# Patient Record
Sex: Female | Born: 1935 | Race: White | Hispanic: No | Smoking: Never smoker
Health system: Southern US, Community
[De-identification: ages and names within clinical notes are randomized; demographics above are authoritative.]

## PROBLEM LIST (undated history)

## (undated) DIAGNOSIS — K5790 Diverticulosis of intestine, part unspecified, without perforation or abscess without bleeding: Secondary | ICD-10-CM

## (undated) DIAGNOSIS — I10 Essential (primary) hypertension: Secondary | ICD-10-CM

## (undated) DIAGNOSIS — Z86010 Personal history of colonic polyps: Secondary | ICD-10-CM

## (undated) DIAGNOSIS — F0390 Unspecified dementia without behavioral disturbance: Secondary | ICD-10-CM

## (undated) DIAGNOSIS — M1990 Unspecified osteoarthritis, unspecified site: Secondary | ICD-10-CM

## (undated) DIAGNOSIS — I5189 Other ill-defined heart diseases: Secondary | ICD-10-CM

## (undated) DIAGNOSIS — E785 Hyperlipidemia, unspecified: Secondary | ICD-10-CM

## (undated) DIAGNOSIS — Z860101 Personal history of adenomatous and serrated colon polyps: Secondary | ICD-10-CM

## (undated) DIAGNOSIS — M810 Age-related osteoporosis without current pathological fracture: Secondary | ICD-10-CM

## (undated) HISTORY — PX: FRACTURE SURGERY: SHX138

## (undated) HISTORY — DX: Hyperlipidemia, unspecified: E78.5

## (undated) HISTORY — DX: Personal history of colonic polyps: Z86.010

## (undated) HISTORY — DX: Unspecified osteoarthritis, unspecified site: M19.90

## (undated) HISTORY — PX: EYE SURGERY: SHX253

## (undated) HISTORY — DX: Personal history of adenomatous and serrated colon polyps: Z86.0101

## (undated) HISTORY — DX: Other ill-defined heart diseases: I51.89

## (undated) HISTORY — PX: OTHER SURGICAL HISTORY: SHX169

## (undated) HISTORY — DX: Diverticulosis of intestine, part unspecified, without perforation or abscess without bleeding: K57.90

## (undated) HISTORY — DX: Essential (primary) hypertension: I10

---

## 1997-10-10 ENCOUNTER — Other Ambulatory Visit: Admission: RE | Admit: 1997-10-10 | Discharge: 1997-10-10 | Payer: Self-pay | Admitting: Cardiology

## 1999-06-26 ENCOUNTER — Encounter: Admission: RE | Admit: 1999-06-26 | Discharge: 1999-06-26 | Payer: Self-pay | Admitting: Cardiology

## 1999-06-26 ENCOUNTER — Encounter: Payer: Self-pay | Admitting: Cardiology

## 1999-07-01 ENCOUNTER — Encounter: Payer: Self-pay | Admitting: Cardiology

## 1999-07-01 ENCOUNTER — Encounter: Admission: RE | Admit: 1999-07-01 | Discharge: 1999-07-01 | Payer: Self-pay | Admitting: Cardiology

## 1999-11-20 ENCOUNTER — Other Ambulatory Visit: Admission: RE | Admit: 1999-11-20 | Discharge: 1999-11-20 | Payer: Self-pay | Admitting: Internal Medicine

## 2000-07-07 ENCOUNTER — Encounter: Admission: RE | Admit: 2000-07-07 | Discharge: 2000-07-07 | Payer: Self-pay | Admitting: Internal Medicine

## 2000-07-07 ENCOUNTER — Encounter: Payer: Self-pay | Admitting: Internal Medicine

## 2001-01-13 ENCOUNTER — Encounter: Payer: Self-pay | Admitting: Internal Medicine

## 2001-07-13 ENCOUNTER — Encounter: Payer: Self-pay | Admitting: Internal Medicine

## 2001-07-13 ENCOUNTER — Encounter: Admission: RE | Admit: 2001-07-13 | Discharge: 2001-07-13 | Payer: Self-pay | Admitting: Internal Medicine

## 2002-07-18 ENCOUNTER — Encounter: Admission: RE | Admit: 2002-07-18 | Discharge: 2002-07-18 | Payer: Self-pay | Admitting: Internal Medicine

## 2002-07-18 ENCOUNTER — Encounter: Payer: Self-pay | Admitting: Internal Medicine

## 2004-02-05 ENCOUNTER — Ambulatory Visit: Payer: Self-pay | Admitting: Internal Medicine

## 2004-02-13 ENCOUNTER — Ambulatory Visit: Payer: Self-pay | Admitting: Internal Medicine

## 2004-02-27 ENCOUNTER — Encounter: Admission: RE | Admit: 2004-02-27 | Discharge: 2004-02-27 | Payer: Self-pay | Admitting: Internal Medicine

## 2005-03-11 ENCOUNTER — Encounter: Admission: RE | Admit: 2005-03-11 | Discharge: 2005-03-11 | Payer: Self-pay | Admitting: Internal Medicine

## 2006-03-23 ENCOUNTER — Encounter: Admission: RE | Admit: 2006-03-23 | Discharge: 2006-03-23 | Payer: Self-pay | Admitting: Internal Medicine

## 2007-04-02 ENCOUNTER — Encounter: Admission: RE | Admit: 2007-04-02 | Discharge: 2007-04-02 | Payer: Self-pay | Admitting: Internal Medicine

## 2008-04-06 ENCOUNTER — Encounter: Admission: RE | Admit: 2008-04-06 | Discharge: 2008-04-06 | Payer: Self-pay | Admitting: Internal Medicine

## 2009-02-22 ENCOUNTER — Encounter (INDEPENDENT_AMBULATORY_CARE_PROVIDER_SITE_OTHER): Payer: Self-pay | Admitting: *Deleted

## 2009-03-23 ENCOUNTER — Ambulatory Visit (HOSPITAL_BASED_OUTPATIENT_CLINIC_OR_DEPARTMENT_OTHER): Admission: RE | Admit: 2009-03-23 | Discharge: 2009-03-23 | Payer: Self-pay | Admitting: Orthopedic Surgery

## 2009-04-17 ENCOUNTER — Encounter (INDEPENDENT_AMBULATORY_CARE_PROVIDER_SITE_OTHER): Payer: Self-pay | Admitting: *Deleted

## 2009-04-26 ENCOUNTER — Encounter: Admission: RE | Admit: 2009-04-26 | Discharge: 2009-04-26 | Payer: Self-pay | Admitting: Internal Medicine

## 2009-08-27 ENCOUNTER — Ambulatory Visit: Payer: Self-pay | Admitting: Internal Medicine

## 2009-08-27 DIAGNOSIS — Z860100 Personal history of colon polyps, unspecified: Secondary | ICD-10-CM | POA: Insufficient documentation

## 2009-08-27 DIAGNOSIS — K5730 Diverticulosis of large intestine without perforation or abscess without bleeding: Secondary | ICD-10-CM | POA: Insufficient documentation

## 2009-09-11 ENCOUNTER — Ambulatory Visit: Payer: Self-pay | Admitting: Internal Medicine

## 2009-09-12 ENCOUNTER — Encounter: Payer: Self-pay | Admitting: Internal Medicine

## 2010-03-27 ENCOUNTER — Other Ambulatory Visit: Payer: Self-pay | Admitting: Internal Medicine

## 2010-03-27 DIAGNOSIS — Z1239 Encounter for other screening for malignant neoplasm of breast: Secondary | ICD-10-CM

## 2010-03-29 ENCOUNTER — Encounter
Admission: RE | Admit: 2010-03-29 | Discharge: 2010-03-29 | Payer: Self-pay | Source: Home / Self Care | Attending: Internal Medicine | Admitting: Internal Medicine

## 2010-04-02 NOTE — Assessment & Plan Note (Signed)
Summary: SCREEN FOR COLON/PT REQ CONSULT FIRST   History of Present Illness Visit Type: Initial Consult Primary GI MD: Yancey Flemings MD Primary Provider: Rodrigo Ran, MD Requesting Provider: Rodrigo Ran, MD Chief Complaint: Colon screening, patient not having any problems History of Present Illness:   77 year old female with hypertension, hyperlipidemia, adenomatous colon polyps, and diverticulosis. She presents today regarding surveillance colonoscopy. Her index colonoscopy was performed in 2002. Small adenoma removed. Followup in 2005 revealed no adenomatous polyps. She is now due for followup surveillance. Her health has been good with no significant interval hospitalizations or surgeries aside from wrist fracture. She's had no problems with her diverticulosis. Hemoccult testing in January was negative. Currently, her GI review of systems is unremarkable. I was sorry to hear that her husband Verdon Cummins pasted.   GI Review of Systems      Denies abdominal pain, acid reflux, belching, bloating, chest pain, dysphagia with liquids, dysphagia with solids, heartburn, loss of appetite, nausea, vomiting, vomiting blood, weight loss, and  weight gain.      Reports diverticulosis.     Denies anal fissure, black tarry stools, change in bowel habit, constipation, diarrhea, fecal incontinence, heme positive stool, hemorrhoids, irritable bowel syndrome, jaundice, light color stool, liver problems, rectal bleeding, and  rectal pain. Preventive Screening-Counseling & Management  Alcohol-Tobacco     Smoking Status: never      Drug Use:  no.      Current Medications (verified): 1)  Losartan Potassium 100 Mg Tabs (Losartan Potassium) .Marland Kitchen.. 1 Tablet By Mouth Once Daily 2)  Simvastatin 10 Mg Tabs (Simvastatin) .Marland Kitchen.. 1 By Mouth Once Daily 3)  Fosamax 70 Mg Tabs (Alendronate Sodium) .Marland Kitchen.. 1 By Mouth Every Week 4)  Calcium 600+d Plus Minerals 600-400 Mg-Unit Tabs (Calcium Carbonate-Vit D-Min) .Marland Kitchen.. 1 By Mouth Two  Times A Day 5)  Vitamin D3 1000 Unit Tabs (Cholecalciferol) .... Once Daily 6)  Aspirin 81 Mg Tbec (Aspirin) .Marland Kitchen.. 1 Tablet By Mouth Once Daily 7)  Vitamin B-12 500 Mcg Tabs (Cyanocobalamin) .... Once Daily 8)  Fish Oil Maximum Strength 1200 Mg Caps (Omega-3 Fatty Acids) .... Take 2 Capsule By Mouth Two Times A Day 9)  Cvs Vision Formula  Tabs (Multiple Vitamins-Minerals) .... Once Daily  Allergies (verified): No Known Drug Allergies  Past History:  Past Medical History: Reviewed history from 07/10/2009 and no changes required. Hypertension Diverticulosis OA Hyperlipidemia Diastolic Dysfunction  Past Surgical History: Unremarkable  Family History: No FH of Colon Cancer:  Social History: Married 2 children Occupation: Retired Patient has never smoked.  Alcohol Use - no Daily Caffeine Use Illicit Drug Use - no Smoking Status:  never Drug Use:  no  Review of Systems       The patient complains of sleeping problems and voice change.  The patient denies allergy/sinus, anemia, anxiety-new, arthritis/joint pain, back pain, blood in urine, breast changes/lumps, change in vision, confusion, cough, coughing up blood, depression-new, fainting, fatigue, fever, headaches-new, hearing problems, heart murmur, heart rhythm changes, itching, menstrual pain, muscle pains/cramps, night sweats, nosebleeds, pregnancy symptoms, shortness of breath, skin rash, sore throat, swelling of feet/legs, swollen lymph glands, thirst - excessive , urination - excessive , urination changes/pain, urine leakage, and vision changes.    Vital Signs:  Patient profile:   77 year old female Height:      62.5 inches Weight:      129.50 pounds BMI:     23.39 Pulse rate:   68 / minute Pulse rhythm:   regular BP sitting:  124 / 66  (left arm) Cuff size:   regular  Vitals Entered By: June McMurray CMA Duncan Dull) (August 27, 2009 2:36 PM)  Physical Exam  General:  Well developed, thin,well nourished, no acute  distress. Head:  Normocephalic and atraumatic. Eyes:  anicteric conjunctiva pink Mouth:  No deformity or lesions Lungs:  Clear throughout to auscultation. Heart:  Regular rate and rhythm; no murmurs, rubs,  or bruits. Abdomen:  Soft, nontender and nondistended. No masses, hepatosplenomegaly or hernias noted. Normal bowel sounds. Rectal:  deferred until colonoscopy Msk:  Symmetrical with no gross deformities. Normal posture. Pulses:  Normal pulses noted. Extremities:  No clubbing, cyanosis, edema or deformities noted. Neurologic:  Alert and  oriented x4;  grossly normal neurologically. Skin:  Intact without significant lesions or rashes. Psych:  Alert and cooperative. Normal mood and affect.   Impression & Recommendations:  Problem # 1:  PERSONAL HISTORY OF COLONIC POLYPS (ICD-V28.52) 77 year old female with a history of adenomatous colon polyps. Medically fit and interested in surveillance colonoscopy. The nature of the procedure as well as the risks, benefits, and alternatives were reviewed in detail. She understood and agreed to proceed. Movi prep prescribed. The patient instructed on its use..  Problem # 2:  DIVERTICULOSIS OF COLON (ICD-562.10) asymptomatic. Continue high-fiber diet.  Other Orders: Colonoscopy (Colon)  Patient Instructions: 1)  Colonoscopy [facility] 09/11/09 9:30 am arrive at 8:30 am 2)  Movi prep instructions given to patient. 3)  Movi prep Rx. sent to pharmacy 4)  Colonoscopy and Flexible Sigmoidoscopy brochure given.  5)  Copy sent to : Rodrigo Ran, MD 6)  The medication list was reviewed and reconciled.  All changed / newly prescribed medications were explained.  A complete medication list was provided to the patient / caregiver. Prescriptions: MOVIPREP 100 GM  SOLR (PEG-KCL-NACL-NASULF-NA ASC-C) As per prep instructions.  #1 x 0   Entered by:   Milford Cage NCMA   Authorized by:   Hilarie Fredrickson MD   Signed by:   Milford Cage NCMA on 08/27/2009   Method used:    Electronically to        [facility]  [place] [address] (retail)       [address]       [place], [place]  [address]       Ph: [phone] or [phone]       Fax: [phone]   RxID:   [id number]

## 2010-04-02 NOTE — Letter (Signed)
Summary: New Patient letter  [facility]  [address] [place], [place] [address]   Phone: [phone]  Fax: [phone]       04/17/2009 MRN: [id number]  Connie Trujillo [address] [place], [place]  [address]  Dear Connie Trujillo,  Welcome to the Gastroenterology Division at [facility].    You are scheduled to see Dr.   Marina Goodell on 06-12-09 at 11am on the 3rd floor at [facility], [address]  We ask that you try to arrive at our  office 15 minutes prior to your appointment time to allow for check-in.  We would like you to complete the enclosed self-administered evaluation form prior to your visit and bring it with you on the day of your appointment.  We will review it with you.  Also, please bring a complete list of all your medications or, if you prefer, bring the medication bottles and we will list them.  Please bring your insurance card so that we may make a copy of it.  If your insurance requires a referral to see a specialist, please bring your referral form from your primary care physician.  Co-payments are due at the time of your visit and may be paid by cash, check or credit card.     Your office visit will consist of a consult with your physician (includes a physical exam), any laboratory testing he/she may order, scheduling of any necessary diagnostic testing (e.g. x-ray, ultrasound, CT-scan), and scheduling of a procedure (e.g. Endoscopy, Colonoscopy) if required.  Please allow enough time on your schedule to allow for any/all of these possibilities.    If you cannot keep your appointment, please call [phone] to cancel or reschedule prior to your appointment date.  This allows [place] the opportunity to schedule an appointment for another patient in need of care.  If you do not cancel or reschedule by 5 p.m. the business day prior to your appointment date, you will be charged a $50.00 late cancellation/no-show fee.    Thank you for choosing [facility] for your medical needs.  We appreciate the opportunity to care for you.  Please visit [place] at our website  to learn more about our practice.                     Sincerely,                                                             The Gastroenterology Division

## 2010-04-02 NOTE — Letter (Signed)
Summary: [facility] Instructions  [facility]  [address] [place], [place] [address]   Phone: [phone]  Fax: [phone]       Connie Trujillo    January 23, 1929    MRN: [id number]        Procedure Day /Date:TUESDAY, 09/11/09     Arrival Time:8:30 AM     Procedure Time:9:30 AM     Location of Procedure:                    X  [facility] (4th Floor)                         PREPARATION FOR COLONOSCOPY WITH MOVIPREP   Starting 5 days prior to your procedure 09/07/09 do not eat nuts, seeds, popcorn, corn, beans, peas,  salads, or any raw vegetables.  Do not take any fiber supplements (e.g. Metamucil, Citrucel, and Benefiber).  THE DAY BEFORE YOUR PROCEDURE         DATE: 09/10/09 DAY: MONDAY  1.  Drink clear liquids the entire day-NO SOLID FOOD  2.  Do not drink anything colored red or purple.  Avoid juices with pulp.  No orange juice.  3.  Drink at least 64 oz. (8 glasses) of fluid/clear liquids during the day to prevent dehydration and help the prep work efficiently.  CLEAR LIQUIDS INCLUDE: Water Jello Ice Popsicles Tea (sugar ok, no milk/cream) Powdered fruit flavored drinks Coffee (sugar ok, no milk/cream) Gatorade Juice: apple, white grape, white cranberry  Lemonade Clear bullion, consomm, broth Carbonated beverages (any kind) Strained chicken noodle soup Hard Candy                             4.  In the morning, mix first dose of MoviPrep solution:    Empty 1 Pouch A and 1 Pouch B into the disposable container    Add lukewarm drinking water to the top line of the container. Mix to dissolve    Refrigerate (mixed solution should be used within 24 hrs)  5.  Begin drinking the prep at 5:00 p.m. The MoviPrep container is divided by 4 marks.   Every 15 minutes drink the solution down to the next mark (approximately 8 oz) until the full liter is complete.   6.  Follow completed prep with 16 oz of clear liquid of your choice (Nothing red or  purple).  Continue to drink clear liquids until bedtime.  7.  Before going to bed, mix second dose of MoviPrep solution:    Empty 1 Pouch A and 1 Pouch B into the disposable container    Add lukewarm drinking water to the top line of the container. Mix to dissolve    Refrigerate  THE DAY OF YOUR PROCEDURE      DATE: 09/11/09 DAY: TUESDAY  Beginning at 4:30 a.m. (5 hours before procedure):         1. Every 15 minutes, drink the solution down to the next mark (approx 8 oz) until the full liter is complete.  2. Follow completed prep with 16 oz. of clear liquid of your choice.    3. You may drink clear liquids until 7:30 AM (2 HOURS BEFORE PROCEDURE).   MEDICATION INSTRUCTIONS  Unless otherwise instructed, you should take regular prescription medications with a small sip of water   as early as possible the morning of your procedure.  OTHER INSTRUCTIONS  You will need a responsible adult at least 75 years of age to accompany you and drive you home.   This person must remain in the waiting room during your procedure.  Wear loose fitting clothing that is easily removed.  Leave jewelry and other valuables at home.  However, you may wish to bring a book to read or  an iPod/MP3 player to listen to music as you wait for your procedure to start.  Remove all body piercing jewelry and leave at home.  Total time from sign-in until discharge is approximately 2-3 hours.  You should go home directly after your procedure and rest.  You can resume normal activities the  day after your procedure.  The day of your procedure you should not:   Drive   Make legal decisions   Operate machinery   Drink alcohol   Return to work  You will receive specific instructions about eating, activities and medications before you leave.    The above instructions have been reviewed and explained to me by   _______________________    I fully understand and can verbalize these  instructions _____________________________ Date _________

## 2010-04-02 NOTE — Procedures (Signed)
Summary: Colonoscopy   Colonoscopy  Procedure date:  02/13/2004  Findings:      Location:  [facility].  Results: Diverticulosis.         Comments:      Repeat colonoscopy in 5 years.   Procedures Next Due Date:    Colonoscopy: 01/2009 Patient Name: Connie Trujillo, Connie Trujillo MRN:  Procedure Procedures: Colonoscopy CPT: [address].  Personnel: Endoscopist: Wilhemina Bonito. Marina Goodell, MD.  Exam Location: Exam performed in Outpatient Clinic. Outpatient  Patient Consent: Procedure, Alternatives, Risks and Benefits discussed, consent obtained, from patient. Consent was obtained by the RN.  Indications  Surveillance of: Adenomatous Polyp(s). This is an initial surveillance exam. Initial polypectomy was performed in 2002. in Nov. 1-2 Polyps were found at Index Exam. Largest polyp removed was 1 to 5 mm. Prior polyp located in distal colon. Pathology of worst  polyp: tubular adenoma.  History  Current Medications: Patient is not currently taking Coumadin.  Pre-Exam Physical: Performed Feb 13, 2004. Entire physical exam was normal.  Exam Exam: Extent of exam reached: Cecum, extent intended: Cecum.  The cecum was identified by appendiceal orifice and IC valve. Patient position: on left side. Colon retroflexion performed. Images taken. ASA Classification: I. Tolerance: excellent.  Monitoring: Pulse and BP monitoring, Oximetry used. Supplemental O2 given.  Colon Prep Used Miralax for colon prep. Prep results: excellent.  Sedation Meds: Patient assessed and found to be appropriate for moderate (conscious) sedation. Fentanyl 50 mcg. given IV. Versed 7 mg. given IV.  Findings NORMAL EXAM: Cecum to Rectum.  - DIVERTICULOSIS: Ascending Colon to Sigmoid Colon. ICD9: Diverticulosis, Colon: 562.10.    Comments: NO POLYPS SEEN Assessment Abnormal examination, see findings above.  Diagnoses: 562.10: Diverticulosis, Colon.   Events  Unplanned Interventions: No intervention was  required.  Unplanned Events: There were no complications. Plans Disposition: After procedure patient sent to recovery. After recovery patient sent home.  Scheduling/Referral: Colonoscopy, to Wilhemina Bonito. Marina Goodell, MD, IN 5 YEARS,    This report was created from the original endoscopy report, which was reviewed and signed by the above listed endoscopist.   cc:  Rodrigo Ran, MD      The Patient

## 2010-04-02 NOTE — Letter (Signed)
Summary: Patient Notice- Polyp Results  [facility]  [address] [place], [place] [address]   Phone: [phone]  Fax: [phone]        September 12, 2009 MRN: [id number]    GARNELL BEGEMAN [address] [place], [place]  [address]    Dear Ms. [name],  I am pleased to inform you that the colon polyp(s) removed during your recent colonoscopy was (were) found to be benign (no cancer detected) upon pathologic examination.   Should you develop new or worsening symptoms of abdominal pain, bowel habit changes or bleeding from the rectum or bowels, please schedule an evaluation with either your primary care physician or with me.  Additional information/recommendations:  __ No further action with gastroenterology is needed at this time. Please      follow-up with your primary care physician for your other healthcare      needs.   Please call us if you are having persistent problems or have questions about your condition that have not been fully answered at this time.  Sincerely,  Hilarie Fredrickson MD  This letter has been electronically signed by your physician.  Appended Document: Patient Notice- Polyp Results letter mailed.

## 2010-04-02 NOTE — Procedures (Signed)
Summary: Colonoscopy  Patient: Sagrario Lineberry Note: All result statuses are Final unless otherwise noted.  Tests: (1) Colonoscopy (COL)   COL Colonoscopy           DONE     [facility]     [address]     [place], [place]  [address]           COLONOSCOPY PROCEDURE REPORT           PATIENT:  Cailah, Reach  MR#:  [id number]     BIRTHDATE:  08/10/1928, 80 yrs. old  GENDER:  female     ENDOSCOPIST:  Wilhemina Bonito. Eda Keys, MD     REF. BY:  Surveillance Program Recall,     PROCEDURE DATE:  09/11/2009     PROCEDURE:  Colonoscopy with snare polypectomy x 1     ASA CLASS:  Class II     INDICATIONS:  history of pre-cancerous (adenomatous) colon polyps,     surveillance and high-risk screening ; index exam 2002 w/ small     adenoma; f/u 2005 negative     MEDICATIONS:   Fentanyl 50 mcg IV, Versed 5 mg IV           DESCRIPTION OF PROCEDURE:   After the risks benefits and     alternatives of the procedure were thoroughly explained, informed     consent was obtained.  Digital rectal exam was performed and     revealed no abnormalities.   The LB [id number] endoscope     was introduced through the anus and advanced to the cecum, which     was identified by both the appendix and ileocecal valve, without     limitations.Time to cecum = 4:17 min.  The quality of the prep was     excellent, using MoviPrep.  The instrument was then slowly     withdrawn (time = 11:22 min) as the colon was fully examined.     <<PROCEDUREIMAGES>>           FINDINGS:  A diminutive polyp was found in the transverse colon.     Polyp was snared without cautery. Retrieval was successful. snare     polyp  Moderate diverticulosis was found in the left colon.     Retroflexed views in the rectum revealed no abnormalities.    The     scope was then withdrawn from the patient and the procedure     completed.           COMPLICATIONS:  None     ENDOSCOPIC IMPRESSION:     1) Diminutive polyp in the transverse colon -  removed     2) Moderate diverticulosis in the left colon           RECOMMENDATIONS:     1) Return to the care of your primary provider. GI follow up as     needed           ______________________________     Wilhemina Bonito. Eda Keys, MD           CC:  Rodrigo Ran, MD;The Patient           n.     Rosalie DoctorWilhemina Bonito. Eda Keys at 09/11/2009 10:13 AM           [employer], [phone]  Note: An exclamation mark (!) indicates a result that was not dispersed into the flowsheet. Document Creation Date: 09/11/2009 10:14 AM _______________________________________________________________________  Marland Kitchen  1) Order result status: Final Collection or observation date-time: 09/11/2009 10:09 Requested date-time:  Receipt date-time:  Reported date-time:  Referring Physician:   Ordering Physician: Fransico Setters [id number]) Specimen Source:  Source: Launa Grill Order Number: [id number] Lab site:   Appended Document: Colonoscopy     Colonoscopy  Procedure date:  09/11/2009  Findings:      Location:  [facility].

## 2010-04-02 NOTE — Procedures (Signed)
Summary: Colonoscopy   Colonoscopy  Procedure date:  01/13/2001  Findings:      Location:  [facility].  Results: Diverticulosis.       Pathology:  Adenomatous polyp.          Procedures Next Due Date:    Colonoscopy: 01/2004 Patient Name: Connie Trujillo, Connie Trujillo MRN:  Procedure Procedures: Colonoscopy CPT: 29528.    with polypectomy. CPT: A3573898.  Personnel: Endoscopist: Wilhemina Bonito. Marina Goodell, MD.  Referred By: Rodrigo Ran, MD.  Exam Location: Exam performed in Outpatient Clinic. Outpatient  Patient Consent: Procedure, Alternatives, Risks and Benefits discussed, consent obtained, from patient.  Indications Symptoms: Change in bowel habits.  Average Risk Screening Routine.  History  Pre-Exam Physical: Performed Jan 13, 2001. Cardio-pulmonary exam, Rectal exam, HEENT exam , Abdominal exam, Extremity exam, Neurological exam, Mental status exam WNL.  Exam Exam: Extent of exam reached: Cecum, extent intended: Cecum.  The cecum was identified by appendiceal orifice and IC valve. Patient position: on left side. Colon retroflexion performed. Images taken. ASA Classification: I. Tolerance: excellent.  Monitoring: Pulse and BP monitoring, Oximetry used. Supplemental O2 given.  Colon Prep Used Golytely for colon prep. Prep results: excellent.  Sedation Meds: Patient assessed and found to be appropriate for moderate (conscious) sedation. Fentanyl 50 mcg. Versed 4 mg.  Findings NORMAL EXAM: Cecum.  - DIVERTICULOSIS: Descending Colon to Sigmoid Colon. ICD9: Diverticulosis, Colon: 562.10.  POLYP: Sigmoid Colon, Maximum size: 5 mm. sessile polyp. Distance from Anus 33 cm. Procedure:  snare with cautery, removed, retrieved, Polyp sent to pathology. ICD9: Colon Polyps: 211.3.   Assessment Abnormal examination, see findings above.  Diagnoses: 562.10: Diverticulosis, Colon.  211.3: Colon Polyps.   Events  Unplanned Interventions: No intervention was required.    Unplanned Events: There were no complications. Plans Medication Plan: Fiber supplements:   Disposition: After procedure patient sent to recovery. After recovery patient sent home.  Scheduling/Referral: Colonoscopy, to Wilhemina Bonito. Marina Goodell, MD, in 3 years if polyp adenomatous,    This report was created from the original endoscopy report, which was reviewed and signed by the above listed endoscopist.   cc:  Rodrigo Ran, MD

## 2010-05-01 ENCOUNTER — Ambulatory Visit
Admission: RE | Admit: 2010-05-01 | Discharge: 2010-05-01 | Disposition: A | Discharge status: Home / Self Care | Payer: BC Managed Care – PPO | Source: Ambulatory Visit | Attending: Internal Medicine | Admitting: Internal Medicine

## 2010-05-01 DIAGNOSIS — Z1239 Encounter for other screening for malignant neoplasm of breast: Secondary | ICD-10-CM

## 2010-05-20 LAB — POCT I-STAT, CHEM 8
Calcium, Ion: 1.03 mmol/L — ABNORMAL LOW (ref 1.12–1.32)
Chloride: 106 mEq/L (ref 96–112)
Creatinine, Ser: 1 mg/dL (ref 0.4–1.2)
Glucose, Bld: 89 mg/dL (ref 70–99)
Potassium: 4.3 mEq/L (ref 3.5–5.1)
Sodium: 140 mEq/L (ref 135–145)
TCO2: 30 mmol/L (ref 0–100)

## 2011-03-25 ENCOUNTER — Other Ambulatory Visit: Payer: Self-pay | Admitting: Internal Medicine

## 2011-03-25 DIAGNOSIS — Z1231 Encounter for screening mammogram for malignant neoplasm of breast: Secondary | ICD-10-CM

## 2011-05-06 ENCOUNTER — Ambulatory Visit: Payer: BC Managed Care – PPO

## 2011-05-07 ENCOUNTER — Ambulatory Visit
Admission: RE | Admit: 2011-05-07 | Discharge: 2011-05-07 | Disposition: A | Discharge status: Home / Self Care | Payer: Medicare Other | Source: Ambulatory Visit | Attending: Internal Medicine | Admitting: Internal Medicine

## 2011-05-07 DIAGNOSIS — Z1231 Encounter for screening mammogram for malignant neoplasm of breast: Secondary | ICD-10-CM

## 2012-05-18 ENCOUNTER — Other Ambulatory Visit: Payer: Self-pay

## 2012-05-18 DIAGNOSIS — Z1231 Encounter for screening mammogram for malignant neoplasm of breast: Secondary | ICD-10-CM

## 2012-06-15 ENCOUNTER — Ambulatory Visit
Admission: RE | Admit: 2012-06-15 | Discharge: 2012-06-15 | Disposition: A | Discharge status: Home / Self Care | Payer: Medicare Other | Source: Ambulatory Visit

## 2012-06-15 DIAGNOSIS — Z1231 Encounter for screening mammogram for malignant neoplasm of breast: Secondary | ICD-10-CM

## 2012-11-25 ENCOUNTER — Encounter: Payer: Self-pay | Admitting: Cardiovascular Disease

## 2012-12-01 ENCOUNTER — Other Ambulatory Visit: Payer: Self-pay | Admitting: *Deleted

## 2012-12-01 DIAGNOSIS — R001 Bradycardia, unspecified: Secondary | ICD-10-CM

## 2012-12-02 ENCOUNTER — Encounter (INDEPENDENT_AMBULATORY_CARE_PROVIDER_SITE_OTHER): Payer: Medicare Other

## 2012-12-02 ENCOUNTER — Encounter: Payer: Self-pay | Admitting: Radiology

## 2012-12-02 DIAGNOSIS — R001 Bradycardia, unspecified: Secondary | ICD-10-CM

## 2012-12-02 DIAGNOSIS — I498 Other specified cardiac arrhythmias: Secondary | ICD-10-CM

## 2012-12-02 NOTE — Progress Notes (Signed)
Patient ID: Connie Trujillo, female   DOB: 07-28-1928, 79 y.o.   MRN: [id number] E Cardio 24 Holter monitor applied

## 2012-12-17 ENCOUNTER — Encounter: Payer: Self-pay | Admitting: *Deleted

## 2012-12-24 ENCOUNTER — Encounter: Payer: Self-pay | Admitting: Cardiovascular Disease

## 2012-12-24 ENCOUNTER — Encounter (INDEPENDENT_AMBULATORY_CARE_PROVIDER_SITE_OTHER): Payer: Self-pay

## 2012-12-24 ENCOUNTER — Ambulatory Visit (INDEPENDENT_AMBULATORY_CARE_PROVIDER_SITE_OTHER): Payer: Medicare Other | Admitting: Cardiovascular Disease

## 2012-12-24 VITALS — BP 122/70 | HR 53 | Ht 63.0 in | Wt 116.0 lb

## 2012-12-24 DIAGNOSIS — R42 Dizziness and giddiness: Secondary | ICD-10-CM

## 2012-12-24 DIAGNOSIS — R55 Syncope and collapse: Secondary | ICD-10-CM

## 2012-12-24 DIAGNOSIS — I498 Other specified cardiac arrhythmias: Secondary | ICD-10-CM

## 2012-12-24 DIAGNOSIS — R001 Bradycardia, unspecified: Secondary | ICD-10-CM

## 2012-12-24 NOTE — Progress Notes (Signed)
Connie Trujillo Date of Birth  10/07/1928       [facility]    [place] [address], [address]  [address] [place], [place]  [address]   [place], [place]  [address] [phone]     [phone]   Fax  [phone]    Fax [phone]  Problem List: 1. Syncope 2. Hypertension 3. Diastolic dysfunction  History of Present Illness:  Connie Trujillo presents today for further evaluation of an episode of syncope and persistent bradycardia.   She has had 2 episodes of near syncope.   She was at church at [employer]  She had been standing for a long time and had not eaten.  She had a second episode at another bell practice that was even worse.  She had had something to eat.  Both episodes occurred after she had been standing for a long time.  She had a 3rd episode that occurred after walking 500 feet to the mailbox.    She feels quite well normally.  She walks 2 1/2 miles each morning - she does this without problems.   She does her house work without any problems.    Current Outpatient Prescriptions on File Prior to Visit  Medication Sig Dispense Refill  . aspirin 81 MG tablet Take 81 mg by mouth daily.      . Calcium Carbonate-Vitamin D (CALCIUM + D PO) Take by mouth 2 (two) times daily.      . Cholecalciferol (VITAMIN D-3 PO) Take 1,000 mg by mouth daily.      Marland Kitchen losartan (COZAAR) 100 MG tablet Take 100 mg by mouth daily.      . Omega-3 Fatty Acids (FISH OIL) 1200 MG CAPS Take 2 capsules by mouth 2 (two) times daily.      . simvastatin (ZOCOR) 10 MG tablet Take 10 mg by mouth at bedtime.      . vitamin B-12 (CYANOCOBALAMIN) 500 MCG tablet Take 500 mcg by mouth daily.      . Multiple Vitamin (MULTIVITAMIN) capsule Take 1 capsule by mouth daily.      . Tamsulosin HCl (FLOMAX PO) Take 70 mg by mouth every 30 (thirty) days.        No current facility-administered medications on file prior to visit.    No Known Allergies  Past Medical History  Diagnosis  Date  . Hypertension   . Diverticulosis   . Diastolic dysfunction   . Hyperlipidemia   . OA (osteoarthritis)   . Hx of adenomatous colonic polyps     No past surgical history on file.  History  Smoking status  . Never Smoker   Smokeless tobacco  . Not on file    History  Alcohol Use: Not on file    No family history on file.  Reviw of Systems:  Reviewed in the HPI.  All other systems are negative.  Physical Exam: Blood pressure 122/70, pulse 53, height 5\' 3"  (1.6 m), weight 116 lb (52.617 kg). General: Well developed, well nourished, in no acute distress.  Head: Normocephalic, atraumatic, sclera non-icteric, mucus membranes are moist,   Neck: Supple. Carotids are 2 + without bruits. No JVD   Lungs: Clear   Heart: RR, normla S1, S2  Abdomen: Soft, non-tender, non-distended with normal bowel sounds.  Msk:  Strength and tone are normal   Extremities: No clubbing or cyanosis. No edema.  Distal pedal pulses are 2+ and equal    Neuro: CN  II - XII intact.  Alert and oriented X 3.   Psych:  Normal  ECG: Oct. 24, 2014:  Sinus brady at 53.   24 hr Holter shows nonsustained atrial tach and marked sinus brady ( at night , asleep).   Assessment / Plan:

## 2012-12-24 NOTE — Patient Instructions (Signed)
Your physician has recommended that you wear an event monitor. Event monitors are medical devices that record the heart's electrical activity. Doctors most often [place] these monitors to diagnose arrhythmias. Arrhythmias are problems with the speed or rhythm of the heartbeat. The monitor is a small, portable device. You can wear one while you do your normal daily activities. This is usually used to diagnose what is causing palpitations/syncope (passing out).  Your physician recommends that you schedule a follow-up appointment in: 2 months  Your physician recommends that you continue on your current medications as directed. Please refer to the Current Medication list given to you today.

## 2012-12-24 NOTE — Assessment & Plan Note (Signed)
Connie Trujillo presents today for further evaluation of near syncope. She has sinus bradycardia at baseline. She's had a 24-hour Holter monitor which reveals nonsustained runs of atrial tachycardia and also some marked sinus bradycardia that occurred in the middle of the night while she was asleep.  At this point we do not have clear cut indication for pacemaker. I would  like to place a 30 day event monitor on her. We will look for significant pauses. I suspect that she'll need a pacemaker at some point.

## 2013-01-06 ENCOUNTER — Encounter (INDEPENDENT_AMBULATORY_CARE_PROVIDER_SITE_OTHER): Payer: Medicare Other

## 2013-01-06 ENCOUNTER — Encounter: Payer: Self-pay | Admitting: *Deleted

## 2013-01-06 DIAGNOSIS — R42 Dizziness and giddiness: Secondary | ICD-10-CM

## 2013-01-06 DIAGNOSIS — I498 Other specified cardiac arrhythmias: Secondary | ICD-10-CM

## 2013-01-06 DIAGNOSIS — R001 Bradycardia, unspecified: Secondary | ICD-10-CM

## 2013-01-06 DIAGNOSIS — R55 Syncope and collapse: Secondary | ICD-10-CM

## 2013-01-06 NOTE — Progress Notes (Signed)
Patient ID: Connie Trujillo, female   DOB: September 04, 1928, 79 y.o.   MRN: [id number] E-Cardio [place] 30 day cardiac event monitor applied to patient.

## 2013-02-15 ENCOUNTER — Telehealth: Payer: Self-pay | Admitting: *Deleted

## 2013-02-15 NOTE — Telephone Encounter (Signed)
Pt was given results of ecardio 30 day monitor. NSR, accelerated junctional rhythm. Nothing seen to explain pre syncopal episode. Pt verbalized understanding.

## 2013-02-21 ENCOUNTER — Ambulatory Visit (INDEPENDENT_AMBULATORY_CARE_PROVIDER_SITE_OTHER): Payer: Medicare Other | Admitting: Cardiovascular Disease

## 2013-02-21 ENCOUNTER — Encounter: Payer: Self-pay | Admitting: Cardiovascular Disease

## 2013-02-21 ENCOUNTER — Encounter (INDEPENDENT_AMBULATORY_CARE_PROVIDER_SITE_OTHER): Payer: Self-pay

## 2013-02-21 VITALS — BP 130/78 | HR 64 | Ht 63.0 in | Wt 117.1 lb

## 2013-02-21 DIAGNOSIS — R55 Syncope and collapse: Secondary | ICD-10-CM

## 2013-02-21 NOTE — Patient Instructions (Signed)
Your physician recommends that you schedule a follow-up appointment in: AS NEEDED BASIS  Your physician recommends that you continue on your current medications as directed. Please refer to the Current Medication list given to you today.    

## 2013-02-21 NOTE — Assessment & Plan Note (Signed)
Mrs. Connie Trujillo has not had any recurrent episodes of syncope or presyncope.  Her 30 day monitor did not show any significant arrhythmias. We will see her on an as-needed basis. She will continue to followup with her medical Doctor - Rodrigo Ran, MD.

## 2013-02-21 NOTE — Progress Notes (Signed)
Connie Trujillo Date of Birth  1929/01/10       [facility]    [place] [address]  [address] [place], [place]  [address]   [place], [place]  [address] [phone]     [phone]   Fax  [phone]    Fax [phone]  Problem List: 1. Syncope 2. Hypertension 3. Diastolic dysfunction  History of Present Illness:  Connie Trujillo presents today for further evaluation of an episode of syncope and persistent bradycardia.   She has had 2 episodes of near syncope.   She was at church at [employer]  She had been standing for a long time and had not eaten.  She had a second episode at another bell practice that was even worse.  She had had something to eat.  Both episodes occurred after she had been standing for a long time.  She had a 3rd episode that occurred after walking 500 feet to the mailbox.    She feels quite well normally.  She walks 2 1/2 miles each morning - she does this without problems.   She does her house work without any problems.    Dec. 22, 2014:  ` The 30 day event monitor did not show any significant arrhythmias that would suggest a cause for her syncope. She has not had any additional episodes of syncope or presyncope.  She is still active - not walking as much because of the weather.   Riding her stationary bike               `        `   Current Outpatient Prescriptions on File Prior to Visit  Medication Sig Dispense Refill  . aspirin 81 MG tablet Take 81 mg by mouth daily.      . Calcium Carbonate-Vitamin D (CALCIUM + D PO) Take by mouth 2 (two) times daily.      . Cholecalciferol (VITAMIN D-3 PO) Take 1,000 mg by mouth daily.      Marland Kitchen galantamine (RAZADYNE) 8 MG tablet Take 8 mg by mouth 2 (two) times daily.      Marland Kitchen losartan (COZAAR) 100 MG tablet Take 100 mg by mouth daily.      . Multiple Vitamin (MULTIVITAMIN) capsule Take 1 capsule by mouth daily.      . Omega-3 Fatty Acids (FISH OIL) 1200 MG CAPS Take 2  capsules by mouth 2 (two) times daily.      . simvastatin (ZOCOR) 10 MG tablet Take 10 mg by mouth at bedtime.      . vitamin B-12 (CYANOCOBALAMIN) 500 MCG tablet Take 500 mcg by mouth daily.       No current facility-administered medications on file prior to visit.    No Known Allergies  Past Medical History  Diagnosis Date  . Hypertension   . Diverticulosis   . Diastolic dysfunction   . Hyperlipidemia   . OA (osteoarthritis)   . Hx of adenomatous colonic polyps     No past surgical history on file.  History  Smoking status  . Never Smoker   Smokeless tobacco  . Not on file    History  Alcohol Use: Not on file    No family history on file.  Reviw of Systems:  Reviewed in the HPI.  All other systems are negative.  Physical Exam: Blood pressure 130/78, pulse 64, height 5\' 3"  (1.6 m), weight 117 lb 1.9  oz (53.125 kg). General: Well developed, well nourished, in no acute distress.  Head: Normocephalic, atraumatic, sclera non-icteric, mucus membranes are moist,   Neck: Supple. Carotids are 2 + without bruits. No JVD   Lungs: Clear   Heart: RR, normla S1, S2  Abdomen: Soft, non-tender, non-distended with normal bowel sounds.  Msk:  Strength and tone are normal   Extremities: No clubbing or cyanosis. No edema.  Distal pedal pulses are 2+ and equal    Neuro: CN II - XII intact.  Alert and oriented X 3.   Psych:  Normal  ECG: Oct. 24, 2014:  Sinus brady at 53.   24 hr Holter shows nonsustained atrial tach and marked sinus brady ( at night , asleep).   Assessment / Plan:

## 2013-05-10 ENCOUNTER — Other Ambulatory Visit: Payer: Self-pay

## 2013-05-10 DIAGNOSIS — Z1231 Encounter for screening mammogram for malignant neoplasm of breast: Secondary | ICD-10-CM

## 2013-06-21 ENCOUNTER — Ambulatory Visit: Payer: Medicare Other

## 2013-06-26 ENCOUNTER — Emergency Department (HOSPITAL_COMMUNITY): Payer: Medicare Other

## 2013-06-26 ENCOUNTER — Emergency Department (HOSPITAL_COMMUNITY)
Admission: EM | Admit: 2013-06-26 | Discharge: 2013-06-26 | Disposition: A | Discharge status: Home / Self Care | Payer: Medicare Other | Attending: Emergency Medicine | Admitting: Emergency Medicine

## 2013-06-26 ENCOUNTER — Encounter (HOSPITAL_COMMUNITY): Payer: Self-pay | Admitting: Emergency Medicine

## 2013-06-26 DIAGNOSIS — R110 Nausea: Secondary | ICD-10-CM | POA: Insufficient documentation

## 2013-06-26 DIAGNOSIS — Z860100 Personal history of colon polyps, unspecified: Secondary | ICD-10-CM | POA: Insufficient documentation

## 2013-06-26 DIAGNOSIS — N390 Urinary tract infection, site not specified: Secondary | ICD-10-CM

## 2013-06-26 DIAGNOSIS — E785 Hyperlipidemia, unspecified: Secondary | ICD-10-CM | POA: Insufficient documentation

## 2013-06-26 DIAGNOSIS — Z7982 Long term (current) use of aspirin: Secondary | ICD-10-CM | POA: Insufficient documentation

## 2013-06-26 DIAGNOSIS — Z8719 Personal history of other diseases of the digestive system: Secondary | ICD-10-CM | POA: Insufficient documentation

## 2013-06-26 DIAGNOSIS — B029 Zoster without complications: Secondary | ICD-10-CM | POA: Insufficient documentation

## 2013-06-26 DIAGNOSIS — M1990 Unspecified osteoarthritis, unspecified site: Secondary | ICD-10-CM | POA: Insufficient documentation

## 2013-06-26 DIAGNOSIS — I10 Essential (primary) hypertension: Secondary | ICD-10-CM | POA: Insufficient documentation

## 2013-06-26 DIAGNOSIS — R5381 Other malaise: Secondary | ICD-10-CM | POA: Insufficient documentation

## 2013-06-26 DIAGNOSIS — R5383 Other fatigue: Secondary | ICD-10-CM

## 2013-06-26 DIAGNOSIS — Z79899 Other long term (current) drug therapy: Secondary | ICD-10-CM | POA: Insufficient documentation

## 2013-06-26 LAB — CBC WITH DIFFERENTIAL/PLATELET
BASOS ABS: 0 10*3/uL (ref 0.0–0.1)
BASOS PCT: 0 % (ref 0–1)
EOS ABS: 0.1 10*3/uL (ref 0.0–0.7)
EOS PCT: 1 % (ref 0–5)
HCT: 41.7 % (ref 36.0–46.0)
Hemoglobin: 14.4 g/dL (ref 12.0–15.0)
Lymphocytes Relative: 16 % (ref 12–46)
Lymphs Abs: 1.3 10*3/uL (ref 0.7–4.0)
MCH: 35.6 pg — AB (ref 26.0–34.0)
MCHC: 34.5 g/dL (ref 30.0–36.0)
MCV: 103 fL — AB (ref 78.0–100.0)
Monocytes Absolute: 0.5 10*3/uL (ref 0.1–1.0)
Monocytes Relative: 6 % (ref 3–12)
NEUTROS PCT: 77 % (ref 43–77)
Neutro Abs: 6.4 10*3/uL (ref 1.7–7.7)
Platelets: 139 10*3/uL — ABNORMAL LOW (ref 150–400)
RBC: 4.05 MIL/uL (ref 3.87–5.11)
RDW: 14.3 % (ref 11.5–15.5)
WBC: 8.3 10*3/uL (ref 4.0–10.5)

## 2013-06-26 LAB — URINALYSIS, ROUTINE W REFLEX MICROSCOPIC
Bilirubin Urine: NEGATIVE
Glucose, UA: NEGATIVE mg/dL
HGB URINE DIPSTICK: NEGATIVE
Ketones, ur: NEGATIVE mg/dL
Nitrite: POSITIVE — AB
PH: 6 (ref 5.0–8.0)
Protein, ur: NEGATIVE mg/dL
SPECIFIC GRAVITY, URINE: 1.015 (ref 1.005–1.030)
Urobilinogen, UA: 0.2 mg/dL (ref 0.0–1.0)

## 2013-06-26 LAB — TROPONIN I: Troponin I: <0.3 ng/mL (ref <0.30)

## 2013-06-26 LAB — COMPREHENSIVE METABOLIC PANEL
ALBUMIN: 3.5 g/dL (ref 3.5–5.2)
ALK PHOS: 41 U/L (ref 39–117)
ALT: 11 U/L (ref 0–35)
AST: 18 U/L (ref 0–37)
BUN: 27 mg/dL — ABNORMAL HIGH (ref 6–23)
CALCIUM: 9.4 mg/dL (ref 8.4–10.5)
CO2: 26 mEq/L (ref 19–32)
Chloride: 105 mEq/L (ref 96–112)
Creatinine, Ser: 1.11 mg/dL — ABNORMAL HIGH (ref 0.50–1.10)
GFR calc Af Amer: 52 mL/min — ABNORMAL LOW (ref >90)
GFR calc non Af Amer: 45 mL/min — ABNORMAL LOW (ref >90)
Glucose, Bld: 114 mg/dL — ABNORMAL HIGH (ref 70–99)
POTASSIUM: 4.2 meq/L (ref 3.7–5.3)
SODIUM: 143 meq/L (ref 137–147)
TOTAL PROTEIN: 6.3 g/dL (ref 6.0–8.3)
Total Bilirubin: 0.7 mg/dL (ref 0.3–1.2)

## 2013-06-26 LAB — URINE MICROSCOPIC-ADD ON

## 2013-06-26 MED ORDER — HYDROCODONE-ACETAMINOPHEN 5-325 MG PO TABS
1.0000 | ORAL_TABLET | Freq: Once | ORAL | Status: AC
  Administered 2013-06-26: 1 via ORAL
  Filled 2013-06-26: qty 1

## 2013-06-26 MED ORDER — HYDROCODONE-ACETAMINOPHEN 5-325 MG PO TABS: 2.0000 | ORAL_TABLET | ORAL | Status: AC | PRN

## 2013-06-26 MED ORDER — CEPHALEXIN 500 MG PO CAPS: 500.0000 mg | ORAL_CAPSULE | Freq: Four times a day (QID) | ORAL | Status: DC

## 2013-06-26 NOTE — ED Notes (Signed)
Pt to ED via [facility] for lightheadedness and near syncopal episode at home. Pt reports headache "for a while;" EMS reports per family pt began c/o headache yesterday. EMS called out for pt not feeling; on arrival, pt reported feeling faint and lightheaded; worse during position changes; also c/o blurred vision with headache

## 2013-06-26 NOTE — ED Provider Notes (Signed)
CSN: [id number]     Arrival date & time 06/26/13  [id number] History   First MD Initiated Contact with Patient 06/26/13 [phone]     Chief Complaint  Patient presents with  . Near Syncope     (Consider location/radiation/quality/duration/timing/severity/associated sxs/prior Treatment) Patient is a 80 y.o. female presenting with near-syncope. The history is provided by the patient. No language interpreter was used.  Near Syncope This is a new problem. The current episode started today. The problem occurs constantly. The problem has been unchanged. Associated symptoms include nausea. Pertinent negatives include no abdominal pain. Nothing aggravates the symptoms. She has tried nothing for the symptoms. The treatment provided no relief.  Pt reports she felt weak this morning.  Pt had shingles 4 weeks ago and has pain in the right side of her chest.   Pt took tramadol with no relief   Past Medical History  Diagnosis Date  . Hypertension   . Diverticulosis   . Diastolic dysfunction   . Hyperlipidemia   . OA (osteoarthritis)   . Hx of adenomatous colonic polyps    Past Surgical History  Procedure Laterality Date  . Fracture surgery     No family history on file. History  Substance Use Topics  . Smoking status: Never Smoker   . Smokeless tobacco: Not on file  . Alcohol Use: No   OB History   Grav Para Term Preterm Abortions TAB SAB Ect Mult Living                 Review of Systems  Cardiovascular: Positive for near-syncope.  Gastrointestinal: Positive for nausea. Negative for abdominal pain.  All other systems reviewed and are negative.     Allergies  Review of patient's allergies indicates no known allergies.  Home Medications   Prior to Admission medications   Medication Sig Start Date End Date Taking? Authorizing Provider  aspirin 81 MG tablet Take 81 mg by mouth 3 (three) times a week. Monday,wednesday and friday   Yes Historical Provider, MD  BIOTIN 5000 PO Take 500 mcg by  mouth daily.   Yes Historical Provider, MD  Calcium Carbonate-Vitamin D (CALCIUM + D PO) Take 1 tablet by mouth 2 (two) times daily.    Yes Historical Provider, MD  Cholecalciferol (VITAMIN D-3 PO) Take 1,000 mg by mouth daily.   Yes Historical Provider, MD  galantamine (RAZADYNE ER) 8 MG 24 hr capsule Take 8 mg by mouth daily with breakfast.   Yes Historical Provider, MD  losartan (COZAAR) 100 MG tablet Take 50 mg by mouth daily.    Yes Historical Provider, MD  Omega-3 Fatty Acids (FISH OIL) 1000 MG CAPS Take 1,000 mg by mouth daily.   Yes Historical Provider, MD  simvastatin (ZOCOR) 10 MG tablet Take 10 mg by mouth at bedtime.   Yes Historical Provider, MD  vitamin B-12 (CYANOCOBALAMIN) 1000 MCG tablet Take 1,000 mcg by mouth daily.   Yes Historical Provider, MD   BP 180/71  Temp(Src) 98 F (36.7 C) (Oral)  Resp 13  SpO2 95% Physical Exam  Nursing note and vitals reviewed. Constitutional: She is oriented to person, place, and time. She appears well-developed and well-nourished.  HENT:  Head: Normocephalic and atraumatic.  Right Ear: External ear normal.  Left Ear: External ear normal.  Nose: Nose normal.  Mouth/Throat: Oropharynx is clear and moist.  Eyes: EOM are normal. Pupils are equal, round, and reactive to light.  Neck: Normal range of motion.  Cardiovascular: Normal rate, regular rhythm,  normal heart sounds and intact distal pulses.   Pulmonary/Chest: Effort normal and breath sounds normal.  Abdominal: Soft. Bowel sounds are normal. She exhibits no distension.  Musculoskeletal: Normal range of motion.  Neurological: She is alert and oriented to person, place, and time.  Skin: Skin is warm.  Psychiatric: She has a normal mood and affect.    ED Course  Procedures (including critical care time) Labs Review Labs Reviewed  CBC WITH DIFFERENTIAL - Abnormal; Notable for the following:    MCV 103.0 (*)    MCH 35.6 (*)    Platelets 139 (*)    All other components within  normal limits  COMPREHENSIVE METABOLIC PANEL  TROPONIN I  URINALYSIS, ROUTINE W REFLEX MICROSCOPIC    Imaging Review No results found.   EKG Interpretation None    Normal sinus, normal EKG.    MDM Pt given hydrocodone for post herpetic pain and she feels better,   Urine shows uti.   Chest xray showed a possible mass,  However repeat xray confirms no mass.   I will treat with keflex and hydrocodone.   Family advised to watch carefully when she takes pain medications   Final diagnoses:  Shingles  UTI (lower urinary tract infection)        Elson AreasLeslie K Shaquelle Hernon, PA-C 06/26/13 1429

## 2013-06-26 NOTE — ED Provider Notes (Signed)
Medical screening examination/treatment/procedure(s) were performed by non-physician practitioner and as supervising physician I was immediately available for consultation/collaboration.   EKG Interpretation None       Oseias Horsey T Sidra Oldfield, MD 06/26/13 1523 

## 2013-06-26 NOTE — ED Notes (Signed)
Patient transported to X-ray 

## 2013-06-26 NOTE — Discharge Instructions (Signed)
Urinary Tract Infection  Urinary tract infections (UTIs) can develop anywhere along your urinary tract. Your urinary tract is your body's drainage system for removing wastes and extra water. Your urinary tract includes two kidneys, two ureters, a bladder, and a urethra. Your kidneys are a pair of bean-shaped organs. Each kidney is about the size of your fist. They are located below your ribs, one on each side of your spine.  CAUSES  Infections are caused by microbes, which are microscopic organisms, including fungi, viruses, and bacteria. These organisms are so small that they can only be seen through a microscope. Bacteria are the microbes that most commonly cause UTIs.  SYMPTOMS   Symptoms of UTIs may vary by age and gender of the patient and by the location of the infection. Symptoms in young women typically include a frequent and intense urge to urinate and a painful, burning feeling in the bladder or urethra during urination. Older women and men are more likely to be tired, shaky, and weak and have muscle aches and abdominal pain. A fever may mean the infection is in your kidneys. Other symptoms of a kidney infection include pain in your back or sides below the ribs, nausea, and vomiting.  DIAGNOSIS  To diagnose a UTI, your caregiver will ask you about your symptoms. Your caregiver also will ask to provide a urine sample. The urine sample will be tested for bacteria and white blood cells. White blood cells are made by your body to help fight infection.  TREATMENT   Typically, UTIs can be treated with medication. Because most UTIs are caused by a bacterial infection, they usually can be treated with the use of antibiotics. The choice of antibiotic and length of treatment depend on your symptoms and the type of bacteria causing your infection.  HOME CARE INSTRUCTIONS   If you were prescribed antibiotics, take them exactly as your caregiver instructs you. Finish the medication even if you feel better after you  have only taken some of the medication.   Drink enough water and fluids to keep your urine clear or pale yellow.   Avoid caffeine, tea, and carbonated beverages. They tend to irritate your bladder.   Empty your bladder often. Avoid holding urine for long periods of time.   Empty your bladder before and after sexual intercourse.   After a bowel movement, women should cleanse from front to back. Use each tissue only once.  SEEK MEDICAL CARE IF:    You have back pain.   You develop a fever.   Your symptoms do not begin to resolve within 3 days.  SEEK IMMEDIATE MEDICAL CARE IF:    You have severe back pain or lower abdominal pain.   You develop chills.   You have nausea or vomiting.   You have continued burning or discomfort with urination.  MAKE SURE YOU:    Understand these instructions.   Will watch your condition.   Will get help right away if you are not doing well or get worse.  Document Released: 11/27/2004 Document Revised: 08/19/2011 Document Reviewed: 03/28/2011  Exit[employer] Patient Information 2014 [employer].

## 2013-06-28 LAB — URINE CULTURE

## 2013-07-01 ENCOUNTER — Telehealth (HOSPITAL_BASED_OUTPATIENT_CLINIC_OR_DEPARTMENT_OTHER): Payer: Self-pay | Admitting: Emergency Medicine

## 2013-07-01 NOTE — Telephone Encounter (Signed)
Post ED Visit - Positive Culture Follow-up  Culture report reviewed by antimicrobial stewardship pharmacist: []  Wes Dulaney, Pharm.D., BCPS []  Celedonio MiyamotoJeremy Frens, Pharm.D., BCPS [x]  Georgina PillionElizabeth Martin, [address]Pharm[address]D., BCPS []  [place]h Pham, [place]arm[place]D., BCPS, AAHIVP []  Estella HuskMichelle Turner, Pharm.D., BCPS, AAHIVP []  Harvie JuniorNathan Cope, Pharm.D.  Positive urine culture Treated with Keflex, organism sensitive to the same and no further patient follow-up is required at this time.  Jeannelle Wiens 07/01/2013, 2:07 PM

## 2014-10-11 ENCOUNTER — Other Ambulatory Visit (HOSPITAL_COMMUNITY): Payer: Self-pay | Admitting: Internal Medicine

## 2014-10-16 ENCOUNTER — Ambulatory Visit (HOSPITAL_COMMUNITY)
Admission: RE | Admit: 2014-10-16 | Discharge: 2014-10-16 | Disposition: A | Discharge status: Home / Self Care | Payer: Medicare Other | Source: Ambulatory Visit | Attending: Internal Medicine | Admitting: Internal Medicine

## 2014-10-16 ENCOUNTER — Encounter (HOSPITAL_COMMUNITY): Payer: Self-pay

## 2014-10-16 DIAGNOSIS — M810 Age-related osteoporosis without current pathological fracture: Secondary | ICD-10-CM | POA: Insufficient documentation

## 2014-10-16 HISTORY — DX: Age-related osteoporosis without current pathological fracture: M81.0

## 2014-10-16 MED ORDER — DENOSUMAB 60 MG/ML ~~LOC~~ SOLN
60.0000 mg | Freq: Once | SUBCUTANEOUS | Status: AC
  Administered 2014-10-16: 60 mg via SUBCUTANEOUS
  Filled 2014-10-16: qty 1

## 2014-10-16 NOTE — Discharge Instructions (Signed)
Denosumab injection What is this medicine? DENOSUMAB (den oh sue mab) slows bone breakdown. Prolia is used to treat osteoporosis in women after menopause and in men. Xgeva is used to prevent bone fractures and other bone problems caused by cancer bone metastases. Xgeva is also used to treat giant cell tumor of the bone. This medicine may be used for other purposes; ask your health care provider or pharmacist if you have questions. COMMON BRAND NAME(S): Prolia, XGEVA What should I tell my health care provider before I take this medicine? They need to know if you have any of these conditions: -dental disease -eczema -infection or history of infections -kidney disease or on dialysis -low blood calcium or vitamin D -malabsorption syndrome -scheduled to have surgery or tooth extraction -taking medicine that contains denosumab -thyroid or parathyroid disease -an unusual reaction to denosumab, other medicines, foods, dyes, or preservatives -pregnant or trying to get pregnant -breast-feeding How should I use this medicine? This medicine is for injection under the skin. It is given by a health care professional in a hospital or clinic setting. If you are getting Prolia, a special MedGuide will be given to you by the pharmacist with each prescription and refill. Be sure to read this information carefully each time. For Prolia, talk to your pediatrician regarding the use of this medicine in children. Special care may be needed. For Xgeva, talk to your pediatrician regarding the use of this medicine in children. While this drug may be prescribed for children as young as 13 years for selected conditions, precautions do apply. Overdosage: If you think you've taken too much of this medicine contact a poison control center or emergency room at once. Overdosage: If you think you have taken too much of this medicine contact a poison control center or emergency room at once. NOTE: This medicine is only for  you. Do not share this medicine with others. What if I miss a dose? It is important not to miss your dose. Call your doctor or health care professional if you are unable to keep an appointment. What may interact with this medicine? Do not take this medicine with any of the following medications: -other medicines containing denosumab This medicine may also interact with the following medications: -medicines that suppress the immune system -medicines that treat cancer -steroid medicines like prednisone or cortisone This list may not describe all possible interactions. Give your health care provider a list of all the medicines, herbs, non-prescription drugs, or dietary supplements you use. Also tell them if you smoke, drink alcohol, or use illegal drugs. Some items may interact with your medicine. What should I watch for while using this medicine? Visit your doctor or health care professional for regular checks on your progress. Your doctor or health care professional may order blood tests and other tests to see how you are doing. Call your doctor or health care professional if you get a cold or other infection while receiving this medicine. Do not treat yourself. This medicine may decrease your body's ability to fight infection. You should make sure you get enough calcium and vitamin D while you are taking this medicine, unless your doctor tells you not to. Discuss the foods you eat and the vitamins you take with your health care professional. See your dentist regularly. Brush and floss your teeth as directed. Before you have any dental work done, tell your dentist you are receiving this medicine. Do not become pregnant while taking this medicine or for 5 months after stopping   it. Women should inform their doctor if they wish to become pregnant or think they might be pregnant. There is a potential for serious side effects to an unborn child. Talk to your health care professional or pharmacist for more  information. What side effects may I notice from receiving this medicine? Side effects that you should report to your doctor or health care professional as soon as possible: -allergic reactions like skin rash, itching or hives, swelling of the face, lips, or tongue -breathing problems -chest pain -fast, irregular heartbeat -feeling faint or lightheaded, falls -fever, chills, or any other sign of infection -muscle spasms, tightening, or twitches -numbness or tingling -skin blisters or bumps, or is dry, peels, or red -slow healing or unexplained pain in the mouth or jaw -unusual bleeding or bruising Side effects that usually do not require medical attention (Report these to your doctor or health care professional if they continue or are bothersome.): -muscle pain -stomach upset, gas This list may not describe all possible side effects. Call your doctor for medical advice about side effects. You may report side effects to FDA at 1-800-FDA-1088. Where should I keep my medicine? This medicine is only given in a clinic, doctor's office, or other health care setting and will not be stored at home. NOTE: This sheet is a summary. It may not cover all possible information. If you have questions about this medicine, talk to your doctor, pharmacist, or health care provider.  2015, [employer]vier/[employer]. (2011-08-18 12:37:47)  

## 2014-10-31 ENCOUNTER — Encounter (HOSPITAL_COMMUNITY): Payer: Self-pay

## 2015-04-24 ENCOUNTER — Ambulatory Visit (HOSPITAL_COMMUNITY)
Admission: RE | Admit: 2015-04-24 | Discharge: 2015-04-24 | Disposition: A | Discharge status: Home / Self Care | Payer: PPO | Source: Ambulatory Visit | Attending: Internal Medicine | Admitting: Internal Medicine

## 2015-09-10 ENCOUNTER — Emergency Department (HOSPITAL_BASED_OUTPATIENT_CLINIC_OR_DEPARTMENT_OTHER): Payer: PPO

## 2015-09-10 ENCOUNTER — Encounter (HOSPITAL_BASED_OUTPATIENT_CLINIC_OR_DEPARTMENT_OTHER): Payer: Self-pay | Admitting: Emergency Medicine

## 2015-09-10 ENCOUNTER — Emergency Department (HOSPITAL_BASED_OUTPATIENT_CLINIC_OR_DEPARTMENT_OTHER)
Admission: EM | Admit: 2015-09-10 | Discharge: 2015-09-10 | Disposition: A | Discharge status: Home / Self Care | Payer: PPO | Attending: Emergency Medicine | Admitting: Emergency Medicine

## 2015-09-10 DIAGNOSIS — M1990 Unspecified osteoarthritis, unspecified site: Secondary | ICD-10-CM | POA: Diagnosis not present

## 2015-09-10 DIAGNOSIS — Z79899 Other long term (current) drug therapy: Secondary | ICD-10-CM | POA: Insufficient documentation

## 2015-09-10 DIAGNOSIS — E785 Hyperlipidemia, unspecified: Secondary | ICD-10-CM | POA: Insufficient documentation

## 2015-09-10 DIAGNOSIS — R531 Weakness: Secondary | ICD-10-CM | POA: Insufficient documentation

## 2015-09-10 DIAGNOSIS — Z79891 Long term (current) use of opiate analgesic: Secondary | ICD-10-CM | POA: Insufficient documentation

## 2015-09-10 DIAGNOSIS — N390 Urinary tract infection, site not specified: Secondary | ICD-10-CM

## 2015-09-10 DIAGNOSIS — I10 Essential (primary) hypertension: Secondary | ICD-10-CM | POA: Insufficient documentation

## 2015-09-10 DIAGNOSIS — Z7982 Long term (current) use of aspirin: Secondary | ICD-10-CM | POA: Insufficient documentation

## 2015-09-10 DIAGNOSIS — R4182 Altered mental status, unspecified: Secondary | ICD-10-CM | POA: Diagnosis present

## 2015-09-10 LAB — URINE MICROSCOPIC-ADD ON: RBC / HPF: NONE SEEN RBC/hpf (ref 0–5)

## 2015-09-10 LAB — URINALYSIS, ROUTINE W REFLEX MICROSCOPIC
BILIRUBIN URINE: NEGATIVE
Glucose, UA: NEGATIVE mg/dL
Hgb urine dipstick: NEGATIVE
Ketones, ur: 15 mg/dL — AB
NITRITE: NEGATIVE
PH: 6.5 (ref 5.0–8.0)
Protein, ur: NEGATIVE mg/dL
SPECIFIC GRAVITY, URINE: 1.016 (ref 1.005–1.030)

## 2015-09-10 LAB — I-STAT VENOUS BLOOD GAS, ED
Acid-Base Excess: 6 mmol/L — ABNORMAL HIGH (ref 0.0–2.0)
BICARBONATE: 32.6 meq/L — AB (ref 20.0–24.0)
O2 Saturation: 31 %
PCO2 VEN: 55.1 mmHg — AB (ref 45.0–50.0)
PH VEN: 7.38 — AB (ref 7.250–7.300)
TCO2: 34 mmol/L (ref 0–100)
pO2, Ven: 21 mmHg — ABNORMAL LOW (ref 31.0–45.0)

## 2015-09-10 LAB — COMPREHENSIVE METABOLIC PANEL
ALBUMIN: 4.2 g/dL (ref 3.5–5.0)
ALT: 22 U/L (ref 14–54)
AST: 33 U/L (ref 15–41)
Alkaline Phosphatase: 42 U/L (ref 38–126)
Anion gap: 8 (ref 5–15)
BILIRUBIN TOTAL: 0.8 mg/dL (ref 0.3–1.2)
BUN: 29 mg/dL — ABNORMAL HIGH (ref 6–20)
CO2: 30 mmol/L (ref 22–32)
CREATININE: 1.27 mg/dL — AB (ref 0.44–1.00)
Calcium: 9.5 mg/dL (ref 8.9–10.3)
Chloride: 101 mmol/L (ref 101–111)
GFR calc Af Amer: 44 mL/min — ABNORMAL LOW (ref >60)
GFR calc non Af Amer: 38 mL/min — ABNORMAL LOW (ref >60)
GLUCOSE: 122 mg/dL — AB (ref 65–99)
POTASSIUM: 4 mmol/L (ref 3.5–5.1)
Sodium: 139 mmol/L (ref 135–145)
TOTAL PROTEIN: 7 g/dL (ref 6.5–8.1)

## 2015-09-10 LAB — CBC
HEMATOCRIT: 44.3 % (ref 36.0–46.0)
Hemoglobin: 15.5 g/dL — ABNORMAL HIGH (ref 12.0–15.0)
MCH: 35.6 pg — ABNORMAL HIGH (ref 26.0–34.0)
MCHC: 35 g/dL (ref 30.0–36.0)
MCV: 101.6 fL — ABNORMAL HIGH (ref 78.0–100.0)
Platelets: 161 10*3/uL (ref 150–400)
RBC: 4.36 MIL/uL (ref 3.87–5.11)
RDW: 13 % (ref 11.5–15.5)
WBC: 6.2 10*3/uL (ref 4.0–10.5)

## 2015-09-10 LAB — TSH: TSH: 1.673 u[IU]/mL (ref 0.350–4.500)

## 2015-09-10 LAB — CBG MONITORING, ED: Glucose-Capillary: 172 mg/dL — ABNORMAL HIGH (ref 65–99)

## 2015-09-10 LAB — TROPONIN I

## 2015-09-10 MED ORDER — SODIUM CHLORIDE 0.9 % IV BOLUS (SEPSIS)
500.0000 mL | Freq: Once | INTRAVENOUS | Status: AC
  Administered 2015-09-10: 500 mL via INTRAVENOUS

## 2015-09-10 MED ORDER — FOSFOMYCIN TROMETHAMINE 3 G PO PACK
3.0000 g | PACK | Freq: Once | ORAL | Status: AC
  Administered 2015-09-10: 3 g via ORAL
  Filled 2015-09-10: qty 3

## 2015-09-10 MED ORDER — FLUCONAZOLE 200 MG PO TABS: 200.0000 mg | ORAL_TABLET | Freq: Once | ORAL | Status: AC

## 2015-09-10 NOTE — ED Notes (Signed)
Pt with weakness and increased confusion since September 03, 2015. Daughter states pt was recently treated for tic bites with doxycyline and assessed for UTI (neg). Pt seeing things and staring off into space. Pt is ambulatory at triage NAD.

## 2015-09-10 NOTE — Discharge Instructions (Signed)

## 2015-09-10 NOTE — ED Notes (Signed)
MD at bedside. 

## 2015-09-10 NOTE — ED Provider Notes (Signed)
CSN: [id number]     Arrival date & time 09/10/15  1223 History   First MD Initiated Contact with Patient 09/10/15 1240     Chief Complaint  Patient presents with  . Weakness  . Altered Mental Status     (Consider location/radiation/quality/duration/timing/severity/associated sxs/prior Treatment) Patient is a 82 y.o. female presenting with weakness and altered mental status. The history is provided by the patient.  Weakness This is a new problem. The current episode started less than 1 hour ago. The problem occurs constantly. The problem has not changed since onset.Pertinent negatives include no chest pain, no abdominal pain, no headaches and no shortness of breath. Nothing aggravates the symptoms. Nothing relieves the symptoms. She has tried nothing for the symptoms. The treatment provided no relief.  Altered Mental Status Associated symptoms: weakness   Associated symptoms: no abdominal pain, no fever, no headaches, no nausea, no palpitations and no vomiting     82 yo F with weakness.  Going on for the past couple days or so. Patient was recently diagnosed with a tick bite and started on doxycycline. Then she has been mildly confused and feeling weak at home. She has been doing a lot of gardening and staying outside in the heat. She has had trouble increasing her oral intake. She denies fevers or chills denies cough congestion abdominal pain dysuria. Denies vomiting or diarrhea.  Past Medical History  Diagnosis Date  . Hypertension   . Diverticulosis   . Diastolic dysfunction   . Hyperlipidemia   . OA (osteoarthritis)   . Hx of adenomatous colonic polyps   . Osteoporosis    Past Surgical History  Procedure Laterality Date  . Fracture surgery      left arm   No family history on file. Social History  Substance Use Topics  . Smoking status: Never Smoker   . Smokeless tobacco: None  . Alcohol Use: No   OB History    No data available     Review of Systems   Constitutional: Positive for activity change. Negative for fever and chills.  HENT: Negative for congestion and rhinorrhea.   Eyes: Negative for redness and visual disturbance.  Respiratory: Negative for shortness of breath and wheezing.   Cardiovascular: Negative for chest pain and palpitations.  Gastrointestinal: Negative for nausea, vomiting and abdominal pain.  Genitourinary: Negative for dysuria and urgency.  Musculoskeletal: Negative for myalgias and arthralgias.  Skin: Negative for pallor and wound.  Neurological: Positive for weakness. Negative for dizziness and headaches.      Allergies  Review of patient's allergies indicates no known allergies.  Home Medications   Prior to Admission medications   Medication Sig Start Date End Date Taking? Authorizing Provider  aspirin 81 MG tablet Take 81 mg by mouth 3 (three) times a week. Monday,wednesday and friday   Yes Historical Provider, MD  BIOTIN 5000 PO Take 500 mcg by mouth daily.   Yes Historical Provider, MD  Calcium Carbonate-Vitamin D (CALCIUM + D PO) Take 1 tablet by mouth 2 (two) times daily.    Yes Historical Provider, MD  Cholecalciferol (VITAMIN D-3 PO) Take 1,000 mg by mouth daily.   Yes Historical Provider, MD  CRANBERRY PO Take 2 capsules by mouth 3 (three) times daily.   Yes Historical Provider, MD  gabapentin (NEURONTIN) 100 MG capsule Take 200 mg by mouth 2 (two) times daily.    Yes Historical Provider, MD  galantamine (RAZADYNE ER) 8 MG 24 hr capsule Take 16 mg by mouth  daily with breakfast.    Yes Historical Provider, MD  losartan (COZAAR) 100 MG tablet Take 50 mg by mouth daily.    Yes Historical Provider, MD  Omega-3 Fatty Acids (FISH OIL) 1000 MG CAPS Take 1,000 mg by mouth daily.   Yes Historical Provider, MD  Probiotic Product (PROBIOTIC DAILY PO) Take by mouth.   Yes Historical Provider, MD  simvastatin (ZOCOR) 10 MG tablet Take 10 mg by mouth at bedtime.   Yes Historical Provider, MD  vitamin B-12  (CYANOCOBALAMIN) 1000 MCG tablet Take 1,000 mcg by mouth daily.   Yes Historical Provider, MD  cephALEXin (KEFLEX) 500 MG capsule Take 1 capsule (500 mg total) by mouth 4 (four) times daily. 06/26/13   Elson Areas, PA-C  HYDROcodone-acetaminophen (NORCO/VICODIN) 5-325 MG per tablet Take 2 tablets by mouth every 4 (four) hours as needed for moderate pain. 06/26/13   Elson Areas, PA-C   BP 161/85 mmHg  Pulse 59  Temp(Src) 97.8 F (36.6 C) (Oral)  Resp 16  Ht 5\' 3"  (1.6 m)  Wt 108 lb (48.988 kg)  BMI 19.14 kg/m2  SpO2 99% Physical Exam  Constitutional: She is oriented to person, place, and time. She appears well-developed and well-nourished. No distress.  HENT:  Head: Normocephalic and atraumatic.  Eyes: EOM are normal. Pupils are equal, round, and reactive to light.  Neck: Normal range of motion. Neck supple.  Cardiovascular: Normal rate and regular rhythm.  Exam reveals no gallop and no friction rub.   No murmur heard. Pulmonary/Chest: Effort normal. She has no wheezes. She has no rales.  Abdominal: Soft. She exhibits no distension. There is no tenderness. There is no rebound.  Musculoskeletal: She exhibits no edema or tenderness.  Neurological: She is alert and oriented to person, place, and time.  No focal weakness   Skin: Skin is warm and dry. She is not diaphoretic.  Psychiatric: She has a normal mood and affect. Her behavior is normal.  Nursing note and vitals reviewed.   ED Course  Procedures (including critical care time) Labs Review Labs Reviewed  COMPREHENSIVE METABOLIC PANEL - Abnormal; Notable for the following:    Glucose, Bld 122 (*)    BUN 29 (*)    Creatinine, Ser 1.27 (*)    GFR calc non Af Amer 38 (*)    GFR calc Af Amer 44 (*)    All other components within normal limits  CBC - Abnormal; Notable for the following:    Hemoglobin 15.5 (*)    MCV 101.6 (*)    MCH 35.6 (*)    All other components within normal limits  URINALYSIS, ROUTINE W REFLEX  MICROSCOPIC (NOT AT [facility]) - Abnormal; Notable for the following:    Ketones, ur 15 (*)    Leukocytes, UA SMALL (*)    All other components within normal limits  URINE MICROSCOPIC-ADD ON - Abnormal; Notable for the following:    Squamous Epithelial / LPF 6-30 (*)    Bacteria, UA MANY (*)    All other components within normal limits  CBG MONITORING, ED - Abnormal; Notable for the following:    Glucose-Capillary 172 (*)    All other components within normal limits  I-STAT VENOUS BLOOD GAS, ED - Abnormal; Notable for the following:    pH, Ven 7.380 (*)    pCO2, Ven 55.1 (*)    pO2, Ven 21.0 (*)    Bicarbonate 32.6 (*)    Acid-Base Excess 6.0 (*)    All other  components within normal limits  TROPONIN I  TSH    Imaging Review Dg Chest 2 View  09/10/2015  CLINICAL DATA:  Increasing weakness and altered mental status. Hallucinations. EXAM: CHEST  2 VIEW COMPARISON:  Chest x-rays dated 06/26/2013 FINDINGS: The heart size and pulmonary vascularity are normal. Tortuosity and calcification of the thoracic aorta. Minimal linear atelectasis at the left base laterally. The lungs are otherwise clear appear no effusions. Chronic accentuation of the thoracic kyphosis. IMPRESSION: No significant acute abnormality.  Aortic atherosclerosis. Electronically Signed   By: Francene BoyersJames  Maxwell M.D.   On: 09/10/2015 13:42   Ct Head Wo Contrast  09/10/2015  CLINICAL DATA:  Increasing weakness and confusion since 09/03/2015. Hallucinations. EXAM: CT HEAD WITHOUT CONTRAST TECHNIQUE: Contiguous axial images were obtained from the base of the skull through the vertex without intravenous contrast. COMPARISON:  Brain MR dated 03/29/2010 FINDINGS: No mass lesion. No midline shift. No acute hemorrhage or hematoma. No extra-axial fluid collections. No evidence of acute infarction. Slight diffuse cerebral cortical and cerebellar atrophy. Old tiny lacunar infarcts in the basal ganglia. Scattered small areas of white matter lucency  bilaterally consistent with chronic small vessel ischemic disease. Bones are normal. IMPRESSION: No acute intracranial abnormality. Slight atrophy. Tiny old lacunar infarcts in the basal ganglia. Chronic small vessel ischemic disease. Electronically Signed   By: Francene BoyersJames  Maxwell M.D.   On: 09/10/2015 13:34   I have personally reviewed and evaluated these images and lab results as part of my medical decision-making.   EKG Interpretation None      MDM   Final diagnoses:  UTI (lower urinary tract infection)    82 yo F with generalized weakness.  I suspect this is environmental based on hx, though will perform lab workup, CT head.   UA with possible UTI, small LE with TNTC bacteria.  No elevated WBC, will treat with fosfomycin.  PCP follow up.   3:22 PM:  I have discussed the diagnosis/risks/treatment options with the patient and family and believe the pt to be eligible for discharge home to follow-up with PCP. We also discussed returning to the ED immediately if new or worsening sx occur. We discussed the sx which are most concerning (e.g., sudden worsening pain, fever, inability to tolerate by mouth ) that necessitate immediate return. Medications administered to the patient during their visit and any new prescriptions provided to the patient are listed below.  Medications given during this visit Medications  sodium chloride 0.9 % bolus 500 mL (0 mLs Intravenous Stopped 09/10/15 1517)  fosfomycin (MONUROL) packet 3 g (3 g Oral Given 09/10/15 1520)    New Prescriptions   No medications on file    The patient appears reasonably screen and/or stabilized for discharge and I doubt any other medical condition or other [facility] requiring further screening, evaluation, or treatment in the ED at this time prior to discharge.    Melene Planan Joliene Salvador, DO 09/10/15 [phone]

## 2015-10-24 ENCOUNTER — Ambulatory Visit (HOSPITAL_COMMUNITY)
Admission: RE | Admit: 2015-10-24 | Discharge: 2015-10-24 | Disposition: A | Discharge status: Home / Self Care | Payer: PPO | Source: Ambulatory Visit | Attending: Internal Medicine | Admitting: Internal Medicine

## 2015-10-24 ENCOUNTER — Encounter (HOSPITAL_COMMUNITY): Payer: Self-pay

## 2015-10-24 DIAGNOSIS — M810 Age-related osteoporosis without current pathological fracture: Secondary | ICD-10-CM | POA: Diagnosis not present

## 2015-10-24 MED ORDER — DENOSUMAB 60 MG/ML ~~LOC~~ SOLN
60.0000 mg | Freq: Once | SUBCUTANEOUS | Status: AC
  Administered 2015-10-24: 60 mg via SUBCUTANEOUS
  Filled 2015-10-24: qty 1

## 2015-10-24 NOTE — Progress Notes (Signed)
Pt confirms she takes daily vitamin d and calcium.  D/c instructions on prolia given to pt and next appointment for February also given to pt.  Pt d/c ambulatory with family member.

## 2015-10-24 NOTE — Discharge Instructions (Signed)
Denosumab injection  What is this medicine?  DENOSUMAB (den oh sue mab) slows bone breakdown. Prolia is used to treat osteoporosis in women after menopause and in men. Xgeva is used to prevent bone fractures and other bone problems caused by cancer bone metastases. Xgeva is also used to treat giant cell tumor of the bone.  This medicine may be used for other purposes; ask your health care provider or pharmacist if you have questions.  What should I tell my health care provider before I take this medicine?  They need to know if you have any of these conditions:  -dental disease  -eczema  -infection or history of infections  -kidney disease or on dialysis  -low blood calcium or vitamin D  -malabsorption syndrome  -scheduled to have surgery or tooth extraction  -taking medicine that contains denosumab  -thyroid or parathyroid disease  -an unusual reaction to denosumab, other medicines, foods, dyes, or preservatives  -pregnant or trying to get pregnant  -breast-feeding  How should I use this medicine?  This medicine is for injection under the skin. It is given by a health care professional in a hospital or clinic setting.  If you are getting Prolia, a special MedGuide will be given to you by the pharmacist with each prescription and refill. Be sure to read this information carefully each time.  For Prolia, talk to your pediatrician regarding the use of this medicine in children. Special care may be needed. For Xgeva, talk to your pediatrician regarding the use of this medicine in children. While this drug may be prescribed for children as young as 13 years for selected conditions, precautions do apply.  Overdosage: If you think you have taken too much of this medicine contact a poison control center or emergency room at once.  NOTE: This medicine is only for you. Do not share this medicine with others.  What if I miss a dose?  It is important not to miss your dose. Call your doctor or health care professional if you are  unable to keep an appointment.  What may interact with this medicine?  Do not take this medicine with any of the following medications:  -other medicines containing denosumab  This medicine may also interact with the following medications:  -medicines that suppress the immune system  -medicines that treat cancer  -steroid medicines like prednisone or cortisone  This list may not describe all possible interactions. Give your health care provider a list of all the medicines, herbs, non-prescription drugs, or dietary supplements you use. Also tell them if you smoke, drink alcohol, or use illegal drugs. Some items may interact with your medicine.  What should I watch for while using this medicine?  Visit your doctor or health care professional for regular checks on your progress. Your doctor or health care professional may order blood tests and other tests to see how you are doing.  Call your doctor or health care professional if you get a cold or other infection while receiving this medicine. Do not treat yourself. This medicine may decrease your body's ability to fight infection.  You should make sure you get enough calcium and vitamin D while you are taking this medicine, unless your doctor tells you not to. Discuss the foods you eat and the vitamins you take with your health care professional.  See your dentist regularly. Brush and floss your teeth as directed. Before you have any dental work done, tell your dentist you are receiving this medicine.  Do   not become pregnant while taking this medicine or for 5 months after stopping it. Women should inform their doctor if they wish to become pregnant or think they might be pregnant. There is a potential for serious side effects to an unborn child. Talk to your health care professional or pharmacist for more information.  What side effects may I notice from receiving this medicine?  Side effects that you should report to your doctor or health care professional as soon as  possible:  -allergic reactions like skin rash, itching or hives, swelling of the face, lips, or tongue  -breathing problems  -chest pain  -fast, irregular heartbeat  -feeling faint or lightheaded, falls  -fever, chills, or any other sign of infection  -muscle spasms, tightening, or twitches  -numbness or tingling  -skin blisters or bumps, or is dry, peels, or red  -slow healing or unexplained pain in the mouth or jaw  -unusual bleeding or bruising  Side effects that usually do not require medical attention (Report these to your doctor or health care professional if they continue or are bothersome.):  -muscle pain  -stomach upset, gas  This list may not describe all possible side effects. Call your doctor for medical advice about side effects. You may report side effects to FDA at 1-800-FDA-1088.  Where should I keep my medicine?  This medicine is only given in a clinic, doctor's office, or other health care setting and will not be stored at home.  NOTE: This sheet is a summary. It may not cover all possible information. If you have questions about this medicine, talk to your doctor, pharmacist, or health care provider.      2016, [employer]. (2011-08-18 12:37:47)

## 2016-04-30 ENCOUNTER — Ambulatory Visit (HOSPITAL_COMMUNITY): Payer: PPO

## 2016-06-07 ENCOUNTER — Emergency Department (HOSPITAL_BASED_OUTPATIENT_CLINIC_OR_DEPARTMENT_OTHER): Payer: Medicare Other

## 2016-06-07 ENCOUNTER — Encounter (HOSPITAL_BASED_OUTPATIENT_CLINIC_OR_DEPARTMENT_OTHER): Payer: Self-pay | Admitting: Emergency Medicine

## 2016-06-07 ENCOUNTER — Emergency Department (HOSPITAL_BASED_OUTPATIENT_CLINIC_OR_DEPARTMENT_OTHER)
Admission: EM | Admit: 2016-06-07 | Discharge: 2016-06-08 | Disposition: A | Discharge status: Home / Self Care | Payer: Medicare Other | Attending: Emergency Medicine | Admitting: Emergency Medicine

## 2016-06-07 DIAGNOSIS — I10 Essential (primary) hypertension: Secondary | ICD-10-CM | POA: Insufficient documentation

## 2016-06-07 DIAGNOSIS — S3992XA Unspecified injury of lower back, initial encounter: Secondary | ICD-10-CM | POA: Diagnosis present

## 2016-06-07 DIAGNOSIS — S20212A Contusion of left front wall of thorax, initial encounter: Secondary | ICD-10-CM | POA: Diagnosis not present

## 2016-06-07 DIAGNOSIS — W1839XA Other fall on same level, initial encounter: Secondary | ICD-10-CM | POA: Diagnosis not present

## 2016-06-07 DIAGNOSIS — Y92002 Bathroom of unspecified non-institutional (private) residence single-family (private) house as the place of occurrence of the external cause: Secondary | ICD-10-CM | POA: Diagnosis not present

## 2016-06-07 DIAGNOSIS — W19XXXA Unspecified fall, initial encounter: Secondary | ICD-10-CM

## 2016-06-07 DIAGNOSIS — Z7982 Long term (current) use of aspirin: Secondary | ICD-10-CM | POA: Insufficient documentation

## 2016-06-07 DIAGNOSIS — Y939 Activity, unspecified: Secondary | ICD-10-CM | POA: Insufficient documentation

## 2016-06-07 DIAGNOSIS — Z79899 Other long term (current) drug therapy: Secondary | ICD-10-CM | POA: Insufficient documentation

## 2016-06-07 DIAGNOSIS — S39012A Strain of muscle, fascia and tendon of lower back, initial encounter: Secondary | ICD-10-CM | POA: Diagnosis not present

## 2016-06-07 DIAGNOSIS — Y999 Unspecified external cause status: Secondary | ICD-10-CM | POA: Insufficient documentation

## 2016-06-07 LAB — URINALYSIS, ROUTINE W REFLEX MICROSCOPIC
Bilirubin Urine: NEGATIVE
GLUCOSE, UA: NEGATIVE mg/dL
HGB URINE DIPSTICK: NEGATIVE
KETONES UR: NEGATIVE mg/dL
LEUKOCYTES UA: NEGATIVE
Nitrite: NEGATIVE
PH: 6.5 (ref 5.0–8.0)
Protein, ur: NEGATIVE mg/dL
Specific Gravity, Urine: 1.022 (ref 1.005–1.030)

## 2016-06-07 MED ORDER — ACETAMINOPHEN 500 MG PO TABS
1000.0000 mg | ORAL_TABLET | Freq: Once | ORAL | Status: AC
  Administered 2016-06-08: 1000 mg via ORAL
  Filled 2016-06-07: qty 2

## 2016-06-07 NOTE — ED Notes (Addendum)
Back from xray via stretcher. Family at [facility]. Alert, NAD, calm, somber/sad, interactive, resps e/u, speaking in clear complete sentences, reluctant or unable to find words to describe feelings, no dyspnea noted, skin W&D, VSS, BP elevated, mentions back pain, bruise noted to R scapula area, (denies: sob, nausea, dizziness), follows commands, answers questions, MAEx4 equal and strong.

## 2016-06-07 NOTE — ED Notes (Signed)
EDP into room, prior to RN assessment, see MD notes, pending orders.   

## 2016-06-07 NOTE — ED Provider Notes (Signed)
[facility] Provider Note   CSN: [id number] Arrival date & time: 06/07/16  1933  By signing my name below, I, Connie Trujillo, attest that this documentation has been prepared under the direction and in the presence of Rolan Bucco, MD . Electronically Signed: Nelwyn Trujillo, Scribe. 06/07/2016. 9:51 PM  History   Chief Complaint Chief Complaint  Patient presents with  . Fall   The history is provided by the patient. No language interpreter was used.    HPI Comments:  Connie Trujillo is a 83 y.o. female with pmhx of HLD and HTN who presents to the Emergency Department complaining of constant, mild back pain s/p fall occurring about 22 hours ago. Pt states that she does not remember much about the accident but that she was able to get back into bed with minor difficulty. Pt's son notes that his mother's memory is problematic at baseline. She states that she is "sore all over". Pt has taken ASA this morning with no relief.  Denies any dizziness, CP, SOB, light-headedness or any other symptoms. Pt has been able to ambulate today without difficulty.   Past Medical History:  Diagnosis Date  . Diastolic dysfunction   . Diverticulosis   . Hx of adenomatous colonic polyps   . Hyperlipidemia   . Hypertension   . OA (osteoarthritis)   . Osteoporosis     Patient Active Problem List   Diagnosis Date Noted  . Near syncope 12/24/2012  . DIVERTICULOSIS OF COLON 08/27/2009  . PERSONAL HISTORY OF COLONIC POLYPS 08/27/2009    Past Surgical History:  Procedure Laterality Date  . FRACTURE SURGERY     left arm    OB History    No data available       Home Medications    Prior to Admission medications   Medication Sig Start Date End Date Taking? Authorizing Provider  memantine (NAMENDA) 10 MG tablet Take 10 mg by mouth 2 (two) times daily.   Yes Historical Provider, MD  nitrofurantoin, macrocrystal-monohydrate, (MACROBID) 100 MG capsule Take 100 mg by mouth 2 (two) times daily.    Yes Historical Provider, MD  aspirin 81 MG tablet Take 81 mg by mouth 3 (three) times a week. Monday,wednesday and friday    Historical Provider, MD  BIOTIN 5000 PO Take 500 mcg by mouth daily.    Historical Provider, MD  Calcium Carbonate-Vitamin D (CALCIUM + D PO) Take 1 tablet by mouth 2 (two) times daily.     Historical Provider, MD  cephALEXin (KEFLEX) 500 MG capsule Take 1 capsule (500 mg total) by mouth 4 (four) times daily. 06/26/13   Elson Areas, PA-C  Cholecalciferol (VITAMIN D-3 PO) Take 1,000 mg by mouth daily.    Historical Provider, MD  CRANBERRY PO Take 2 capsules by mouth 3 (three) times daily.    Historical Provider, MD  fluconazole (DIFLUCAN) 200 MG tablet Take 1 tablet (200 mg total) by mouth once. 09/10/15   Melene Plan, DO  gabapentin (NEURONTIN) 100 MG capsule Take 200 mg by mouth 2 (two) times daily.     Historical Provider, MD  galantamine (RAZADYNE ER) 8 MG 24 hr capsule Take 16 mg by mouth daily with breakfast.     Historical Provider, MD  HYDROcodone-acetaminophen (NORCO/VICODIN) 5-325 MG per tablet Take 2 tablets by mouth every 4 (four) hours as needed for moderate pain. 06/26/13   Elson Areas, PA-C  losartan (COZAAR) 100 MG tablet Take 50 mg by mouth daily.  Historical Provider, MD  Omega-3 Fatty Acids (FISH OIL) 1000 MG CAPS Take 1,000 mg by mouth daily.    Historical Provider, MD  Probiotic Product (PROBIOTIC DAILY PO) Take by mouth.    Historical Provider, MD  simvastatin (ZOCOR) 10 MG tablet Take 10 mg by mouth at bedtime.    Historical Provider, MD  trimethoprim (TRIMPEX) 100 MG tablet Take 100 mg by mouth at bedtime.    Historical Provider, MD  vitamin B-12 (CYANOCOBALAMIN) 1000 MCG tablet Take 1,000 mcg by mouth daily. Pt takes 3 tablets daily    Historical Provider, MD    Family History No family history on file.  Social History Social History  Substance Use Topics  . Smoking status: Never Smoker  . Smokeless tobacco: Never Used  . Alcohol use No       Allergies   Patient has no known allergies.   Review of Systems Review of Systems  Constitutional: Negative for chills, diaphoresis, fatigue and fever.  HENT: Negative for congestion, rhinorrhea and sneezing.   Eyes: Negative.   Respiratory: Negative for cough, chest tightness and shortness of breath.   Cardiovascular: Negative for chest pain and leg swelling.  Gastrointestinal: Negative for abdominal pain, blood in stool, diarrhea, nausea and vomiting.  Genitourinary: Negative for difficulty urinating, flank pain, frequency and hematuria.  Musculoskeletal: Positive for arthralgias, back pain and myalgias.  Skin: Negative for rash.  Neurological: Negative for dizziness, speech difficulty, weakness, numbness and headaches.     Physical Exam Updated Vital Signs BP (!) 189/85   Pulse (!) 59   Temp 97.9 F (36.6 C) (Oral)   Resp (!) 27   Ht  (1.6 m)   Wt 115 lb (52.2 kg)   SpO2 100%   BMI 20.37 kg/m   Physical Exam  Constitutional: She is oriented to person, place, and time. She appears well-developed and well-nourished.  HENT:  Head: Normocephalic and atraumatic.  Eyes: Pupils are equal, round, and reactive to light.  Neck: Normal range of motion. Neck supple.  Cardiovascular: Normal rate, regular rhythm and normal heart sounds.   Pulmonary/Chest: Effort normal and breath sounds normal. No respiratory distress. She has no wheezes. She has no rales. She exhibits no tenderness.  Abdominal: Soft. Bowel sounds are normal. There is no tenderness. There is no rebound and no guarding.  Musculoskeletal: Normal range of motion. She exhibits no edema.  Tenderness along left lateral ribs. No crepitus or deformity. No overlying ecchymosis.   No pain to cervical or thoracic spine. Tenderness to upper and mid lumbosacral spine. No step offs or deformities. No pain on palpation or ROM of the extremities.   Lymphadenopathy:    She has no cervical adenopathy.  Neurological:  She is alert and oriented to person, place, and time.  Motor 5/5 all extremities Sensation grossly intact to LT all extremities Finger to Nose intact, no pronator drift CN II-XII grossly intact Gait normal  Skin: Skin is warm and dry. No rash noted.  Psychiatric: She has a normal mood and affect.     ED Treatments / Results  DIAGNOSTIC STUDIES:  Oxygen Saturation is 97% on RA, normal by my interpretation.    COORDINATION OF CARE:  10:01 PM Discussed treatment plan with pt at bedside urinalysis and X-ray imaging and pt agreed to plan.  Labs (all labs ordered are listed, but only abnormal results are displayed) Labs Reviewed  URINE CULTURE  URINALYSIS, ROUTINE W REFLEX MICROSCOPIC    EKG  EKG Interpretation  Date/Time:  Saturday June 07 2016 22:21:10 EDT Ventricular Rate:  55 PR Interval:    QRS Duration: 96 QT Interval:  426 QTC Calculation: 408 R Axis:   39 Text Interpretation:  Sinus rhythm Consider left atrial enlargement since last tracing no significant change Confirmed by Katheryn Culliton  MD, Evan Osburn ([id number]) on 06/07/2016 10:48:22 PM       Radiology Dg Chest 2 View  Result Date: 06/07/2016 CLINICAL DATA:  Fall last night. EXAM: CHEST  2 VIEW COMPARISON:  09/10/2015 FINDINGS: Lungs are adequately inflated without focal consolidation or effusion. Mild stable cardiomegaly. Subtle compression deformity over the lower thoracic spine unchanged. Degenerative change of the spine. Minimal calcified plaque over the thoracic aorta. IMPRESSION: No acute cardiopulmonary disease. Mild cardiomegaly unchanged. Aortic atherosclerosis. Stable mild compression deformity over the lower thoracic spine. Electronically Signed   By: Elberta Fortis M.D.   On: 06/07/2016 20:55   Dg Lumbar Spine Complete  Result Date: 06/07/2016 CLINICAL DATA:  Pain after fall last evening. Low back pain and stiffness. EXAM: LUMBAR SPINE - COMPLETE 4+ VIEW COMPARISON:  None. FINDINGS: Osteopenic appearance of the dorsal  spine. Normal lumbar segmentation and alignment. Slight disc space narrowing noted from T11 through L2 with small anterior osteophytes. Mild disc space narrowing also noted from L3 through L5 with moderate disc space narrowing at L5-S1. Most marked at L5-S1 Sclerosis and joint space narrowing of the L4 through S1 facets consistent with degenerative facet arthropathy. No spondylolysis nor spondylolisthesis. No acute fracture identified. SI joints are maintained bilaterally. IMPRESSION: 1. Osteopenic appearance of the dorsal spine with lower lumbar facet arthropathy from L4 through S1. 2. Mild disc space narrowing from T11 through L2 and from L3 through L5 with moderate disc space narrowing at L5-S1. 3. No acute fracture nor bone destruction. Electronically Signed   By: Tollie Eth M.D.   On: 06/07/2016 23:10    Procedures Procedures (including critical care time)  Medications Ordered in ED Medications  acetaminophen (TYLENOL) tablet 1,000 mg (not administered)     Initial Impression / Assessment and Plan / ED Course  I have reviewed the triage vital signs and the nursing notes.  Pertinent labs & imaging results that were available during my care of the patient were reviewed by me and considered in my medical decision making (see chart for details).     Patient presents after a fall. It's unclear what led to the fall. Patient denies any dizziness chest pain shortness of breath weakness or other symptoms that made her fall but she doesn't know exactly what happened. She denies any head injury. She has no neck pain. She had some mild tenderness to her left ribs and lower back. There is no bony injuries noted. No evidence of pneumothorax. No other evidence of injuries. Given the unclear circumstances a fall, I did do an EKG and a urinalysis which are unremarkable. She was discharged home with family. She was advised to follow-up with her PCP for recheck. Return precautions were given. She was advised  to use Tylenol for symptomatic relief.  Final Clinical Impressions(s) / ED Diagnoses   Final diagnoses:  Fall, initial encounter  Rib contusion, left, initial encounter  Lumbar strain, initial encounter    New Prescriptions New Prescriptions   No medications on file  I personally performed the services described in this documentation, which was scribed in my presence.  The recorded information has been reviewed and considered.      Rolan Bucco, MD 06/07/16 [phone]

## 2016-06-07 NOTE — ED Triage Notes (Signed)
PT presents to ED with c/o back pain after she fell in her bathroom last night. Pt lives alone BIB son.

## 2016-06-07 NOTE — ED Notes (Signed)
Pt up to [facility]

## 2016-06-07 NOTE — ED Notes (Signed)
Pt not in room, pt in xray.  

## 2016-06-07 NOTE — ED Notes (Signed)
Pt in xray

## 2016-06-09 LAB — URINE CULTURE: CULTURE: NO GROWTH

## 2016-08-25 ENCOUNTER — Encounter (HOSPITAL_COMMUNITY): Payer: Self-pay

## 2016-08-25 ENCOUNTER — Ambulatory Visit (HOSPITAL_COMMUNITY)
Admission: RE | Admit: 2016-08-25 | Discharge: 2016-08-25 | Disposition: A | Discharge status: Home / Self Care | Payer: Medicare Other | Source: Ambulatory Visit | Attending: Internal Medicine | Admitting: Internal Medicine

## 2016-08-25 DIAGNOSIS — M810 Age-related osteoporosis without current pathological fracture: Secondary | ICD-10-CM | POA: Insufficient documentation

## 2016-08-25 MED ORDER — DENOSUMAB 60 MG/ML ~~LOC~~ SOLN
60.0000 mg | Freq: Once | SUBCUTANEOUS | Status: AC
  Administered 2016-08-25: 60 mg via SUBCUTANEOUS
  Filled 2016-08-25: qty 1

## 2016-08-25 NOTE — Discharge Instructions (Signed)
Denosumab injection °What is this medicine? °DENOSUMAB (den oh sue mab) slows bone breakdown. Prolia is used to treat osteoporosis in women after menopause and in men. Xgeva is used to treat a high calcium level due to cancer and to prevent bone fractures and other bone problems caused by multiple myeloma or cancer bone metastases. Xgeva is also used to treat giant cell tumor of the bone. °This medicine may be used for other purposes; ask your health care provider or pharmacist if you have questions. °COMMON BRAND NAME(S): Prolia, XGEVA °What should I tell my health care provider before I take this medicine? °They need to know if you have any of these conditions: °-dental disease °-having surgery or tooth extraction °-infection °-kidney disease °-low levels of calcium or Vitamin D in the blood °-malnutrition °-on hemodialysis °-skin conditions or sensitivity °-thyroid or parathyroid disease °-an unusual reaction to denosumab, other medicines, foods, dyes, or preservatives °-pregnant or trying to get pregnant °-breast-feeding °How should I use this medicine? °This medicine is for injection under the skin. It is given by a health care professional in a hospital or clinic setting. °If you are getting Prolia, a special MedGuide will be given to you by the pharmacist with each prescription and refill. Be sure to read this information carefully each time. °For Prolia, talk to your pediatrician regarding the use of this medicine in children. Special care may be needed. For Xgeva, talk to your pediatrician regarding the use of this medicine in children. While this drug may be prescribed for children as young as 13 years for selected conditions, precautions do apply. °Overdosage: If you think you have taken too much of this medicine contact a poison control center or emergency room at once. °NOTE: This medicine is only for you. Do not share this medicine with others. °What if I miss a dose? °It is important not to miss your  dose. Call your doctor or health care professional if you are unable to keep an appointment. °What may interact with this medicine? °Do not take this medicine with any of the following medications: °-other medicines containing denosumab °This medicine may also interact with the following medications: °-medicines that lower your chance of fighting infection °-steroid medicines like prednisone or cortisone °This list may not describe all possible interactions. Give your health care provider a list of all the medicines, herbs, non-prescription drugs, or dietary supplements you use. Also tell them if you smoke, drink alcohol, or use illegal drugs. Some items may interact with your medicine. °What should I watch for while using this medicine? °Visit your doctor or health care professional for regular checks on your progress. Your doctor or health care professional may order blood tests and other tests to see how you are doing. °Call your doctor or health care professional for advice if you get a fever, chills or sore throat, or other symptoms of a cold or flu. Do not treat yourself. This drug may decrease your body's ability to fight infection. Try to avoid being around people who are sick. °You should make sure you get enough calcium and vitamin D while you are taking this medicine, unless your doctor tells you not to. Discuss the foods you eat and the vitamins you take with your health care professional. °See your dentist regularly. Brush and floss your teeth as directed. Before you have any dental work done, tell your dentist you are receiving this medicine. °Do not become pregnant while taking this medicine or for 5 months after stopping   it. Talk with your doctor or health care professional about your birth control options while taking this medicine. Women should inform their doctor if they wish to become pregnant or think they might be pregnant. There is a potential for serious side effects to an unborn child. Talk  to your health care professional or pharmacist for more information. What side effects may I notice from receiving this medicine? Side effects that you should report to your doctor or health care professional as soon as possible: -allergic reactions like skin rash, itching or hives, swelling of the face, lips, or tongue -bone pain -breathing problems -dizziness -jaw pain, especially after dental work -redness, blistering, peeling of the skin -signs and symptoms of infection like fever or chills; cough; sore throat; pain or trouble passing urine -signs of low calcium like fast heartbeat, muscle cramps or muscle pain; pain, tingling, numbness in the hands or feet; seizures -unusual bleeding or bruising -unusually weak or tired Side effects that usually do not require medical attention (report to your doctor or health care professional if they continue or are bothersome): -constipation -diarrhea -headache -joint pain -loss of appetite -muscle pain -runny nose -tiredness -upset stomach This list may not describe all possible side effects. Call your doctor for medical advice about side effects. You may report side effects to FDA at 1-800-FDA-1088. Where should I keep my medicine? This medicine is only given in a clinic, doctor's office, or other health care setting and will not be stored at home. NOTE: This sheet is a summary. It may not cover all possible information. If you have questions about this medicine, talk to your doctor, pharmacist, or health care provider.  2018 [employer] Standard (2016-03-11 19:17:21)

## 2017-02-18 ENCOUNTER — Emergency Department (HOSPITAL_BASED_OUTPATIENT_CLINIC_OR_DEPARTMENT_OTHER)
Admission: EM | Admit: 2017-02-18 | Discharge: 2017-02-18 | Disposition: A | Discharge status: Home / Self Care | Payer: Medicare Other | Attending: Emergency Medicine | Admitting: Emergency Medicine

## 2017-02-18 ENCOUNTER — Emergency Department (HOSPITAL_BASED_OUTPATIENT_CLINIC_OR_DEPARTMENT_OTHER): Payer: Medicare Other

## 2017-02-18 ENCOUNTER — Other Ambulatory Visit: Payer: Self-pay

## 2017-02-18 ENCOUNTER — Encounter (HOSPITAL_BASED_OUTPATIENT_CLINIC_OR_DEPARTMENT_OTHER): Payer: Self-pay | Admitting: *Deleted

## 2017-02-18 DIAGNOSIS — W1830XA Fall on same level, unspecified, initial encounter: Secondary | ICD-10-CM | POA: Insufficient documentation

## 2017-02-18 DIAGNOSIS — N3000 Acute cystitis without hematuria: Secondary | ICD-10-CM

## 2017-02-18 DIAGNOSIS — S0990XA Unspecified injury of head, initial encounter: Secondary | ICD-10-CM | POA: Diagnosis not present

## 2017-02-18 DIAGNOSIS — Y9389 Activity, other specified: Secondary | ICD-10-CM | POA: Diagnosis not present

## 2017-02-18 DIAGNOSIS — Z7982 Long term (current) use of aspirin: Secondary | ICD-10-CM | POA: Diagnosis not present

## 2017-02-18 DIAGNOSIS — Z79899 Other long term (current) drug therapy: Secondary | ICD-10-CM | POA: Diagnosis not present

## 2017-02-18 DIAGNOSIS — Y999 Unspecified external cause status: Secondary | ICD-10-CM | POA: Diagnosis not present

## 2017-02-18 DIAGNOSIS — B9620 Unspecified Escherichia coli [E. coli] as the cause of diseases classified elsewhere: Secondary | ICD-10-CM | POA: Insufficient documentation

## 2017-02-18 DIAGNOSIS — I10 Essential (primary) hypertension: Secondary | ICD-10-CM | POA: Insufficient documentation

## 2017-02-18 DIAGNOSIS — Y92018 Other place in single-family (private) house as the place of occurrence of the external cause: Secondary | ICD-10-CM | POA: Diagnosis not present

## 2017-02-18 LAB — URINALYSIS, MICROSCOPIC (REFLEX)

## 2017-02-18 LAB — URINALYSIS, ROUTINE W REFLEX MICROSCOPIC
Bilirubin Urine: NEGATIVE
GLUCOSE, UA: NEGATIVE mg/dL
KETONES UR: 15 mg/dL — AB
Nitrite: POSITIVE — AB
PROTEIN: NEGATIVE mg/dL
Specific Gravity, Urine: >1.03 — ABNORMAL HIGH (ref 1.005–1.030)
pH: 6 (ref 5.0–8.0)

## 2017-02-18 MED ORDER — CEPHALEXIN 500 MG PO CAPS: 500.0000 mg | ORAL_CAPSULE | Freq: Two times a day (BID) | ORAL | 0 refills | Status: DC

## 2017-02-18 NOTE — ED Notes (Signed)
C/o fall last pm  Denies loc  States rt side of neck hurts, may be pain from shingles in past

## 2017-02-18 NOTE — ED Triage Notes (Signed)
Pt c/o fall last night injuring head. bruising noted to left side of face

## 2017-02-18 NOTE — Discharge Instructions (Signed)
Please read and follow all provided instructions.  Your diagnoses today include:  1. Minor head injury, initial encounter   2. Acute cystitis without hematuria     Tests performed today include:  CT scan of your head that did not show any serious injury.  Urine test - suggests UTI  Vital signs. See below for your results today.   Medications prescribed:   Keflex (cephalexin) - antibiotic  You have been prescribed an antibiotic medicine: take the entire course of medicine even if you are feeling better. Stopping early can cause the antibiotic not to work.  Take any prescribed medications only as directed.  Home care instructions:  Follow any educational materials contained in this packet.  BE VERY CAREFUL not to take multiple medicines containing Tylenol (also called acetaminophen). Doing so can lead to an overdose which can damage your liver and cause liver failure and possibly death.   Follow-up instructions: Please follow-up with your primary care provider in the next 7 days for further evaluation of your symptoms.   Return instructions:  SEEK IMMEDIATE MEDICAL ATTENTION IF:  There is confusion or drowsiness (although children frequently become drowsy after injury).   You cannot awaken the injured person.   You have more than one episode of vomiting.   You notice dizziness or unsteadiness which is getting worse, or inability to walk.   You have convulsions or unconsciousness.   You experience severe, persistent headaches not relieved by Tylenol.  You cannot use arms or legs normally.   There are changes in pupil sizes. (This is the black center in the colored part of the eye)   There is clear or bloody discharge from the nose or ears.   You have change in speech, vision, swallowing, or understanding.   Localized weakness, numbness, tingling, or change in bowel or bladder control.  You have any other emergent concerns.  Additional Information: You have had  a head injury which does not appear to require admission at this time.  Your vital signs today were: BP (!) 169/71 (BP Location: Left Arm)    Pulse (!) 59    Temp 98.1 F (36.7 C) (Oral)    Resp 16    Ht 5\' 2"  (1.575 m)    Wt 47.6 kg (105 lb)    SpO2 99%    BMI 19.20 kg/m  If your blood pressure (BP) was elevated above 135/85 this visit, please have this repeated by your doctor within one month. --------------

## 2017-02-18 NOTE — ED Provider Notes (Signed)
[facility] EMERGENCY DEPARTMENT Provider Note   CSN: [id number] Arrival date & time: 02/18/17  1800     History   Chief Complaint Chief Complaint  Patient presents with  . Fall    HPI Connie Trujillo is a 83 y.o. female.  Patient with history of UTI, no anticoagulation --presents with complaint of fall last night.  Patient states that she was up and fell onto the floor last night causing bruising to the left side of her face.  She estimates that she laid on the floor for about half the night.  When her daughters saw her this afternoon they noticed the bruising on her face prompting emergency department visit.  Patient complained of some left knee soreness as well.  No vision change, neck pain, vomiting. No treatments PTA. The onset of this condition was acute. The course is constant. Aggravating factors: none. Alleviating factors: none. Pt ambulatory at baseline.        Past Medical History:  Diagnosis Date  . Diastolic dysfunction   . Diverticulosis   . Hx of adenomatous colonic polyps   . Hyperlipidemia   . Hypertension   . OA (osteoarthritis)   . Osteoporosis     Patient Active Problem List   Diagnosis Date Noted  . Near syncope 12/24/2012  . DIVERTICULOSIS OF COLON 08/27/2009  . PERSONAL HISTORY OF COLONIC POLYPS 08/27/2009    Past Surgical History:  Procedure Laterality Date  . EYE SURGERY     cataract surgery   . FRACTURE SURGERY     left arm  . growth removed from shoulder       OB History    No data available       Home Medications    Prior to Admission medications   Medication Sig Start Date End Date Taking? Authorizing Provider  aspirin 81 MG tablet Take 81 mg by mouth 3 (three) times a week. Monday,wednesday and friday    [provider]  BIOTIN 5000 PO Take 5,000 mcg by mouth daily.     [provider]  Calcium Carbonate-Vitamin D (CALCIUM + D PO) Take 1 tablet by mouth 2 (two) times daily.     [provider]  cephALEXin (KEFLEX) 500 MG capsule Take 1 capsule (500 mg total) by mouth 4 (four) times daily. 06/26/13   Elson AreasSofia, Leslie K, PA-C  Cholecalciferol (VITAMIN D-3 PO) Take 1,000 mg by mouth daily.    [provider]  CRANBERRY PO Take 2 capsules by mouth 3 (three) times daily.    [provider]  fluconazole (DIFLUCAN) 200 MG tablet Take 1 tablet (200 mg total) by mouth once. 09/10/15   Melene PlanFloyd, Dan, DO  gabapentin (NEURONTIN) 100 MG capsule Take 200 mg by mouth at bedtime.     [provider]  galantamine (RAZADYNE ER) 8 MG 24 hr capsule Take 16 mg by mouth daily with breakfast.     [provider]  HYDROcodone-acetaminophen (NORCO/VICODIN) 5-325 MG per tablet Take 2 tablets by mouth every 4 (four) hours as needed for moderate pain. 06/26/13   Elson AreasSofia, Leslie K, PA-C  losartan (COZAAR) 100 MG tablet Take 50 mg by mouth daily.     [provider]  memantine (NAMENDA) 10 MG tablet Take 10 mg by mouth 2 (two) times daily.    [provider]  nitrofurantoin, macrocrystal-monohydrate, (MACROBID) 100 MG capsule Take 50 mg by mouth daily.     [provider]  Omega-3 Fatty Acids (FISH OIL)  1000 MG CAPS Take 1,000 mg by mouth daily.    [provider]  Probiotic Product (PROBIOTIC DAILY PO) Take by mouth.    [provider]  simvastatin (ZOCOR) 10 MG tablet Take 10 mg by mouth at bedtime.    [provider]  trimethoprim (TRIMPEX) 100 MG tablet Take 100 mg by mouth at bedtime.    [provider]  vitamin B-12 (CYANOCOBALAMIN) 1000 MCG tablet Take 1,000 mcg by mouth daily. Pt takes 3 tablets daily    [provider]    Family History History reviewed. No pertinent family history.  Social History Social History   Tobacco Use  . Smoking status: Never Smoker  . Smokeless tobacco: Never Used  Substance Use Topics  . Alcohol use: No  . Drug use: No     Allergies   Patient has no known  allergies.   Review of Systems Review of Systems  Constitutional: Negative for fatigue.  HENT: Negative for facial swelling and tinnitus.        Facial contusion  Eyes: Negative for photophobia, pain and visual disturbance.  Respiratory: Negative for shortness of breath.   Cardiovascular: Negative for chest pain.  Gastrointestinal: Negative for nausea and vomiting.  Musculoskeletal: Positive for arthralgias. Negative for back pain, gait problem and neck pain.  Skin: Negative for wound.  Neurological: Negative for dizziness, weakness, light-headedness, numbness and headaches.  Psychiatric/Behavioral: Negative for confusion and decreased concentration.     Physical Exam Updated Vital Signs BP (!) 169/71 (BP Location: Left Arm)   Pulse (!) 59   Temp 98.1 F (36.7 C) (Oral)   Resp 16   Ht 5\' 2"  (1.575 m)   Wt 47.6 kg (105 lb)   SpO2 99%   BMI 19.20 kg/m   Physical Exam  Constitutional: She is oriented to person, place, and time. She appears well-developed and well-nourished.  HENT:  Head: Normocephalic. Head is without raccoon's eyes and without Battle's sign.  Right Ear: Tympanic membrane, external ear and ear canal normal. No hemotympanum.  Left Ear: Tympanic membrane, external ear and ear canal normal. No hemotympanum.  Nose: Nose normal. No nasal septal hematoma.  Mouth/Throat: Uvula is midline, oropharynx is clear and moist and mucous membranes are normal.  Slight ecchymosis above her head.  No point tenderness.  Normal motion of the jaw without malocclusion.  No tenderness over the zygoma.  Eyes: Conjunctivae, EOM and lids are normal. Pupils are equal, round, and reactive to light. Right eye exhibits no nystagmus. Left eye exhibits no nystagmus.  No visible hyphema noted  Neck: Normal range of motion. Neck supple.  Cardiovascular: Normal rate and regular rhythm.  Pulmonary/Chest: Effort normal and breath sounds normal.  Abdominal: Soft. There is no tenderness.    Musculoskeletal:       Right hip: Normal. She exhibits normal range of motion.       Left hip: Normal. She exhibits normal range of motion.       Right knee: Normal. No tenderness found.       Left knee: She exhibits normal range of motion. Tenderness found.       Right ankle: Normal.       Left ankle: Normal.       Cervical back: She exhibits normal range of motion, no tenderness and no bony tenderness.       Thoracic back: She exhibits no tenderness and no bony tenderness.       Lumbar back: She exhibits no tenderness and  no bony tenderness.  Neurological: She is alert and oriented to person, place, and time. She has normal strength and normal reflexes. No cranial nerve deficit or sensory deficit. Coordination normal. GCS eye subscore is 4. GCS verbal subscore is 5. GCS motor subscore is 6.  Skin: Skin is warm and dry.  Psychiatric: She has a normal mood and affect.  Nursing note and vitals reviewed.    ED Treatments / Results  Labs (all labs ordered are listed, but only abnormal results are displayed) Labs Reviewed  URINALYSIS, ROUTINE W REFLEX MICROSCOPIC - Abnormal; Notable for the following components:      Result Value   APPearance CLOUDY (*)    Specific Gravity, Urine >1.030 (*)    Hgb urine dipstick TRACE (*)    Ketones, ur 15 (*)    Nitrite POSITIVE (*)    Leukocytes, UA SMALL (*)    All other components within normal limits  URINALYSIS, MICROSCOPIC (REFLEX) - Abnormal; Notable for the following components:   Bacteria, UA MANY (*)    Squamous Epithelial / LPF 0-5 (*)    All other components within normal limits  URINE CULTURE    EKG  EKG Interpretation None       Radiology Ct Head Wo Contrast  Result Date: 02/18/2017 CLINICAL DATA:  Larey Seat last night hitting LEFT head. History of hypertension. EXAM: CT HEAD WITHOUT CONTRAST TECHNIQUE: Contiguous axial images were obtained from the base of the skull through the vertex without intravenous contrast. COMPARISON:   CT HEAD September 10, 2015 FINDINGS: BRAIN: No intraparenchymal hemorrhage, mass effect nor midline shift. The ventricles and sulci are overall normal for patient's age, however there is mild symmetric advanced parietal lobe atrophy. Patchy supratentorial white matter hypodensities within normal range for patient's age, though non-specific are most compatible with chronic small vessel ischemic disease. No acute large vascular territory infarcts. No abnormal extra-axial fluid collections. Basal cisterns are patent. VASCULAR: Moderate to severe calcific atherosclerosis of the carotid siphons. SKULL: No skull fracture. Small LEFT frontal scalp hematoma without subcutaneous gas or radiopaque foreign bodies. SINUSES/ORBITS: The mastoid air-cells and included paranasal sinuses are well-aerated.The included ocular globes and orbital contents are non-suspicious. Status post bilateral ocular lens implants. OTHER: None. IMPRESSION: 1. No acute intracranial process. Small LEFT frontal scalp hematoma. No skull fracture. 2. Stable examination including moderate chronic small vessel ischemic disease. Electronically Signed   By: Awilda Metro M.D.   On: 02/18/2017 19:55   Dg Knee Complete 4 Views Left  Result Date: 02/18/2017 CLINICAL DATA:  83 y/o  F; left knee pain after fall. EXAM: LEFT KNEE - COMPLETE 4+ VIEW COMPARISON:  None. FINDINGS: No evidence of fracture, dislocation, or joint effusion. Femorotibial compartment chondrocalcinosis. Vascular calcification. IMPRESSION: 1. No acute fracture or dislocation identified. 2. Femorotibial compartment chondrocalcinosis. Electronically Signed   By: Mitzi Hansen M.D.   On: 02/18/2017 20:09    Procedures Procedures (including critical care time)  Medications Ordered in ED Medications - No data to display   Initial Impression / Assessment and Plan / ED Course  I have reviewed the triage vital signs and the nursing notes.  Pertinent labs & imaging results  that were available during my care of the patient were reviewed by me and considered in my medical decision making (see chart for details).     Patient seen and examined. Discussed with Dr. Clarene Duke who will see.  Vital signs reviewed and are as follows: BP (!) 169/71 (BP Location: Left Arm)  Pulse (!) 59   Temp 98.1 F (36.7 C) (Oral)   Resp 16   Ht 5\' 2"  (1.575 m)   Wt 47.6 kg (105 lb)   SpO2 99%   BMI 19.20 kg/m   8:57 PM patient seen by Dr. Clarene Duke and updated on results.  Will treat for UTI with Keflex.  Patient was counseled on head injury precautions and symptoms that should indicate their return to the ED.  These include severe worsening headache, vision changes, confusion, loss of consciousness, trouble walking, nausea & vomiting, or weakness/tingling in extremities.     Final Clinical Impressions(s) / ED Diagnoses   Final diagnoses:  Minor head injury, initial encounter  Acute cystitis without hematuria   Head injury/knee injury: Imaging negative.  No signs of muscular injury to suggest rhabdo.  No chest pain, shortness of breath, lightheadedness.  Doubt cardiac etiology.  Acute cystitis: No systemic symptoms of illness, treat with Keflex.  ED Discharge Orders        Ordered    cephALEXin (KEFLEX) 500 MG capsule  2 times daily     02/18/17 2056       Renne Crigler, PA-C 02/18/17 2059    Little, Ambrose Finland, MD 02/20/17 [phone]

## 2017-02-18 NOTE — ED Notes (Signed)
Pt transported to xray 

## 2017-02-21 LAB — URINE CULTURE

## 2017-02-22 ENCOUNTER — Telehealth: Payer: Self-pay

## 2017-02-22 NOTE — Telephone Encounter (Signed)
Post ED Visit - Positive Culture Follow-up  Culture report reviewed by antimicrobial stewardship pharmacist:  []  Connie Trujillo, Pharm.D. []  Connie Trujillo, Pharm.D., BCPS AQ-ID []  Connie Trujillo, Pharm.D., BCPS []  Connie Trujillo, Pharm.D., BCPS []  Connie Trujillo, [address]Pharm[address]D., BCPS, AAHIVP []  Connie Trujillo, Pharm.D., BCPS, AAHIVP [x]  Connie Trujillo, PharmD, BCPS []  Connie Trujillo, PharmD, BCPS []  Connie Trujillo, PharmD, BCPS  Positive urine culture Treated with Cephalexin, organism sensitive to the same and no further patient follow-up is required at this time.  Connie Trujillo, Connie Trujillo 02/22/2017, 10:33 AM

## 2017-03-04 ENCOUNTER — Encounter (HOSPITAL_COMMUNITY): Payer: Self-pay

## 2017-03-04 ENCOUNTER — Ambulatory Visit (HOSPITAL_COMMUNITY)
Admission: RE | Admit: 2017-03-04 | Discharge: 2017-03-04 | Disposition: A | Discharge status: Home / Self Care | Payer: Medicare Other | Source: Ambulatory Visit | Attending: Internal Medicine | Admitting: Internal Medicine

## 2017-03-04 DIAGNOSIS — M810 Age-related osteoporosis without current pathological fracture: Secondary | ICD-10-CM | POA: Insufficient documentation

## 2017-03-04 HISTORY — DX: Unspecified dementia, unspecified severity, without behavioral disturbance, psychotic disturbance, mood disturbance, and anxiety: F03.90

## 2017-03-04 MED ORDER — DENOSUMAB 60 MG/ML ~~LOC~~ SOLN
60.0000 mg | Freq: Once | SUBCUTANEOUS | Status: AC
  Administered 2017-03-04: 60 mg via SUBCUTANEOUS
  Filled 2017-03-04: qty 1

## 2017-03-04 NOTE — Discharge Instructions (Signed)
Denosumab injection / Prolia What is this medicine? DENOSUMAB (den oh sue mab) slows bone breakdown. Prolia is used to treat osteoporosis in women after menopause and in men. Delton See is used to treat a high calcium level due to cancer and to prevent bone fractures and other bone problems caused by multiple myeloma or cancer bone metastases. Delton See is also used to treat giant cell tumor of the bone. This medicine may be used for other purposes; ask your health care provider or pharmacist if you have questions. COMMON BRAND NAME(S): Prolia, XGEVA What should I tell my health care provider before I take this medicine? They need to know if you have any of these conditions: -dental disease -having surgery or tooth extraction -infection -kidney disease -low levels of calcium or Vitamin D in the blood -malnutrition -on hemodialysis -skin conditions or sensitivity -thyroid or parathyroid disease -an unusual reaction to denosumab, other medicines, foods, dyes, or preservatives -pregnant or trying to get pregnant -breast-feeding How should I use this medicine? This medicine is for injection under the skin. It is given by a health care professional in a hospital or clinic setting. If you are getting Prolia, a special MedGuide will be given to you by the pharmacist with each prescription and refill. Be sure to read this information carefully each time. For Prolia, talk to your pediatrician regarding the use of this medicine in children. Special care may be needed. For Delton See, talk to your pediatrician regarding the use of this medicine in children. While this drug may be prescribed for children as young as 13 years for selected conditions, precautions do apply. Overdosage: If you think you have taken too much of this medicine contact a poison control center or emergency room at once. NOTE: This medicine is only for you. Do not share this medicine with others. What if I miss a dose? It is important not to  miss your dose. Call your doctor or health care professional if you are unable to keep an appointment. What may interact with this medicine? Do not take this medicine with any of the following medications: -other medicines containing denosumab This medicine may also interact with the following medications: -medicines that lower your chance of fighting infection -steroid medicines like prednisone or cortisone This list may not describe all possible interactions. Give your health care provider a list of all the medicines, herbs, non-prescription drugs, or dietary supplements you use. Also tell them if you smoke, drink alcohol, or use illegal drugs. Some items may interact with your medicine. What should I watch for while using this medicine? Visit your doctor or health care professional for regular checks on your progress. Your doctor or health care professional may order blood tests and other tests to see how you are doing. Call your doctor or health care professional for advice if you get a fever, chills or sore throat, or other symptoms of a cold or flu. Do not treat yourself. This drug may decrease your body's ability to fight infection. Try to avoid being around people who are sick. You should make sure you get enough calcium and vitamin D while you are taking this medicine, unless your doctor tells you not to. Discuss the foods you eat and the vitamins you take with your health care professional. See your dentist regularly. Brush and floss your teeth as directed. Before you have any dental work done, tell your dentist you are receiving this medicine. Do not become pregnant while taking this medicine or for 5 months  after stopping it. Talk with your doctor or health care professional about your birth control options while taking this medicine. Women should inform their doctor if they wish to become pregnant or think they might be pregnant. There is a potential for serious side effects to an unborn  child. Talk to your health care professional or pharmacist for more information. What side effects may I notice from receiving this medicine? Side effects that you should report to your doctor or health care professional as soon as possible: -allergic reactions like skin rash, itching or hives, swelling of the face, lips, or tongue -bone pain -breathing problems -dizziness -jaw pain, especially after dental work -redness, blistering, peeling of the skin -signs and symptoms of infection like fever or chills; cough; sore throat; pain or trouble passing urine -signs of low calcium like fast heartbeat, muscle cramps or muscle pain; pain, tingling, numbness in the hands or feet; seizures -unusual bleeding or bruising -unusually weak or tired Side effects that usually do not require medical attention (report to your doctor or health care professional if they continue or are bothersome): -constipation -diarrhea -headache -joint pain -loss of appetite -muscle pain -runny nose -tiredness -upset stomach This list may not describe all possible side effects. Call your doctor for medical advice about side effects. You may report side effects to FDA at 1-800-FDA-1088. Where should I keep my medicine? This medicine is only given in a clinic, doctor's office, or other health care setting and will not be stored at home. NOTE: This sheet is a summary. It may not cover all possible information. If you have questions about this medicine, talk to your doctor, pharmacist, or health care provider.  2018 [employer]vier/Gold Standard (2016-03-11 19:17:21)

## 2017-09-07 ENCOUNTER — Ambulatory Visit (HOSPITAL_COMMUNITY)
Admission: RE | Admit: 2017-09-07 | Discharge: 2017-09-07 | Disposition: A | Discharge status: Home / Self Care | Payer: Medicare Other | Source: Ambulatory Visit | Attending: Internal Medicine | Admitting: Internal Medicine

## 2017-09-07 DIAGNOSIS — M810 Age-related osteoporosis without current pathological fracture: Secondary | ICD-10-CM | POA: Insufficient documentation

## 2017-09-07 MED ORDER — DENOSUMAB 60 MG/ML ~~LOC~~ SOSY
60.0000 mg | PREFILLED_SYRINGE | Freq: Once | SUBCUTANEOUS | Status: AC
  Administered 2017-09-07: 60 mg via SUBCUTANEOUS
  Filled 2017-09-07: qty 1

## 2017-12-11 ENCOUNTER — Encounter (HOSPITAL_COMMUNITY): Payer: Self-pay | Admitting: *Deleted

## 2017-12-11 ENCOUNTER — Emergency Department (HOSPITAL_COMMUNITY)
Admission: EM | Admit: 2017-12-11 | Discharge: 2017-12-11 | Disposition: A | Discharge status: Home / Self Care | Payer: Medicare Other | Attending: Emergency Medicine | Admitting: Emergency Medicine

## 2017-12-11 ENCOUNTER — Emergency Department (HOSPITAL_COMMUNITY): Payer: Medicare Other

## 2017-12-11 DIAGNOSIS — Y929 Unspecified place or not applicable: Secondary | ICD-10-CM | POA: Diagnosis not present

## 2017-12-11 DIAGNOSIS — F0390 Unspecified dementia without behavioral disturbance: Secondary | ICD-10-CM | POA: Insufficient documentation

## 2017-12-11 DIAGNOSIS — Z79899 Other long term (current) drug therapy: Secondary | ICD-10-CM | POA: Insufficient documentation

## 2017-12-11 DIAGNOSIS — Y999 Unspecified external cause status: Secondary | ICD-10-CM | POA: Insufficient documentation

## 2017-12-11 DIAGNOSIS — W19XXXA Unspecified fall, initial encounter: Secondary | ICD-10-CM | POA: Insufficient documentation

## 2017-12-11 DIAGNOSIS — Y939 Activity, unspecified: Secondary | ICD-10-CM | POA: Diagnosis not present

## 2017-12-11 DIAGNOSIS — N3001 Acute cystitis with hematuria: Secondary | ICD-10-CM | POA: Insufficient documentation

## 2017-12-11 DIAGNOSIS — S0990XA Unspecified injury of head, initial encounter: Secondary | ICD-10-CM | POA: Diagnosis present

## 2017-12-11 DIAGNOSIS — Z7982 Long term (current) use of aspirin: Secondary | ICD-10-CM | POA: Diagnosis not present

## 2017-12-11 LAB — URINALYSIS, ROUTINE W REFLEX MICROSCOPIC
Bilirubin Urine: NEGATIVE
Glucose, UA: NEGATIVE mg/dL
Ketones, ur: NEGATIVE mg/dL
Nitrite: POSITIVE — AB
Protein, ur: NEGATIVE mg/dL
Specific Gravity, Urine: 1.015 (ref 1.005–1.030)
WBC, UA: >50 WBC/hpf — ABNORMAL HIGH (ref 0–5)
pH: 7 (ref 5.0–8.0)

## 2017-12-11 LAB — BASIC METABOLIC PANEL
Anion gap: 8 (ref 5–15)
BUN: 26 mg/dL — ABNORMAL HIGH (ref 8–23)
CO2: 26 mmol/L (ref 22–32)
Calcium: 9 mg/dL (ref 8.9–10.3)
Chloride: 104 mmol/L (ref 98–111)
Creatinine, Ser: 1.66 mg/dL — ABNORMAL HIGH (ref 0.44–1.00)
GFR calc Af Amer: 31 mL/min — ABNORMAL LOW (ref >60)
GFR calc non Af Amer: 27 mL/min — ABNORMAL LOW (ref >60)
Glucose, Bld: 120 mg/dL — ABNORMAL HIGH (ref 70–99)
Potassium: 5.5 mmol/L — ABNORMAL HIGH (ref 3.5–5.1)
Sodium: 138 mmol/L (ref 135–145)

## 2017-12-11 LAB — CBC
HCT: 48.9 % — ABNORMAL HIGH (ref 36.0–46.0)
Hemoglobin: 15.9 g/dL — ABNORMAL HIGH (ref 12.0–15.0)
MCH: 33.2 pg (ref 26.0–34.0)
MCHC: 32.5 g/dL (ref 30.0–36.0)
MCV: 102.1 fL — ABNORMAL HIGH (ref 80.0–100.0)
Platelets: 237 10*3/uL (ref 150–400)
RBC: 4.79 MIL/uL (ref 3.87–5.11)
RDW: 13.4 % (ref 11.5–15.5)
WBC: 11.6 10*3/uL — ABNORMAL HIGH (ref 4.0–10.5)
nRBC: 0 % (ref 0.0–0.2)

## 2017-12-11 MED ORDER — CEPHALEXIN 500 MG PO CAPS: 500.0000 mg | ORAL_CAPSULE | Freq: Two times a day (BID) | ORAL | 0 refills | Status: AC

## 2017-12-11 MED ORDER — CEPHALEXIN 250 MG PO CAPS
1000.0000 mg | ORAL_CAPSULE | Freq: Once | ORAL | Status: AC
  Administered 2017-12-11: 1000 mg via ORAL
  Filled 2017-12-11: qty 4

## 2017-12-11 NOTE — ED Notes (Signed)
Discharge paperwork reviewed with son.

## 2017-12-11 NOTE — ED Provider Notes (Signed)
[facility] EMERGENCY DEPARTMENT Provider Note   CSN: [id number] Arrival date & time: 12/11/17  1152     History   Chief Complaint Chief Complaint  Patient presents with  . Fall    HPI Connie Trujillo is a 84 y.o. female.  HPI   84 year old female presenting after a fall.  She is unsure as to exactly how why she fell.  She states that she does went down.  She did not lose consciousness.  She did strike her head.  She denies any Szymon headache, neck or back pain.  She has been ambulatory since she fell but seemed a little bit off balance.  She is complaining of some dysuria.  She does get frequent UTIs.  She is on trimethoprim prophylactically.  Her son is at bedside.  He states that she is at her baseline but she has a mild tremor in her hands which is new.  Past Medical History:  Diagnosis Date  . Dementia (HCC)   . Diastolic dysfunction   . Diverticulosis   . Hx of adenomatous colonic polyps   . Hyperlipidemia   . Hypertension   . OA (osteoarthritis)   . Osteoporosis     Patient Active Problem List   Diagnosis Date Noted  . Near syncope 12/24/2012  . DIVERTICULOSIS OF COLON 08/27/2009  . PERSONAL HISTORY OF COLONIC POLYPS 08/27/2009    Past Surgical History:  Procedure Laterality Date  . EYE SURGERY     cataract surgery   . FRACTURE SURGERY     left arm  . growth removed from shoulder        OB History   None      Home Medications    Prior to Admission medications   Medication Sig Start Date End Date Taking? Authorizing Provider  aspirin 81 MG tablet Take 81 mg by mouth 3 (three) times a week. Monday,wednesday and friday    [provider]  BIOTIN 5000 PO Take 5,000 mcg by mouth daily.     [provider]  Calcium Carbonate-Vitamin D (CALCIUM + D PO) Take 1 tablet by mouth 2 (two) times daily.     [provider]  cephALEXin (KEFLEX) 500 MG capsule Take 1 capsule (500 mg total) by mouth 2 (two) times daily.  02/18/17   Renne Crigler, PA-C  Cholecalciferol (VITAMIN D-3 PO) Take 1,000 mg by mouth daily.    [provider]  CRANBERRY PO Take 2 capsules by mouth 3 (three) times daily.    [provider]  fluconazole (DIFLUCAN) 200 MG tablet Take 1 tablet (200 mg total) by mouth once. 09/10/15   Melene Plan, DO  gabapentin (NEURONTIN) 100 MG capsule Take 200 mg by mouth at bedtime.     [provider]  galantamine (RAZADYNE ER) 8 MG 24 hr capsule Take 16 mg by mouth daily with breakfast.     [provider]  HYDROcodone-acetaminophen (NORCO/VICODIN) 5-325 MG per tablet Take 2 tablets by mouth every 4 (four) hours as needed for moderate pain. 06/26/13   Elson Areas, PA-C  losartan (COZAAR) 100 MG tablet Take 50 mg by mouth daily.     [provider]  memantine (NAMENDA) 10 MG tablet Take 10 mg by mouth 2 (two) times daily.    [provider]  nitrofurantoin, macrocrystal-monohydrate, (MACROBID) 100 MG capsule Take 50 mg by mouth daily.     [provider]  Omega-3 Fatty Acids (FISH OIL) 1000 MG CAPS Take  1,000 mg by mouth daily.    [provider]  Probiotic Product (PROBIOTIC DAILY PO) Take by mouth.    [provider]  simvastatin (ZOCOR) 10 MG tablet Take 10 mg by mouth at bedtime.    [provider]  trimethoprim (TRIMPEX) 100 MG tablet Take 100 mg by mouth at bedtime.    [provider]  vitamin B-12 (CYANOCOBALAMIN) 1000 MCG tablet Take 1,000 mcg by mouth daily. Pt takes 3 tablets daily    [provider]    Family History History reviewed. No pertinent family history.  Social History Social History   Tobacco Use  . Smoking status: Never Smoker  . Smokeless tobacco: Never Used  Substance Use Topics  . Alcohol use: No  . Drug use: No     Allergies   Patient has no known allergies.   Review of Systems Review of Systems  All systems reviewed and negative, other than as noted  in HPI.  Physical Exam Updated Vital Signs BP (!) 126/99   Pulse 61   Temp 98 F (36.7 C) (Oral)   Resp 20   SpO2 100%   Physical Exam  Constitutional: She appears well-developed and well-nourished. No distress.  HENT:  Head: Normocephalic.  Small abrasion to left frontal region.  No significant tenderness.  No midline spinal tenderness.  Eyes: Conjunctivae are normal. Right eye exhibits no discharge. Left eye exhibits no discharge.  Neck: Neck supple.  Cardiovascular: Normal rate, regular rhythm and normal heart sounds. Exam reveals no gallop and no friction rub.  No murmur heard. Pulmonary/Chest: Effort normal and breath sounds normal. No respiratory distress.  Abdominal: Soft. She exhibits no distension. There is no tenderness.  Musculoskeletal: She exhibits no edema or tenderness.  Neurological: She is alert. No cranial nerve deficit. She exhibits normal muscle tone. Coordination normal.  Mild resting tremor noted in bilateral hands.  Abates with movement.  Skin: Skin is warm and dry.  Psychiatric: She has a normal mood and affect. Her behavior is normal. Thought content normal.  Nursing note and vitals reviewed.    ED Treatments / Results  Labs (all labs ordered are listed, but only abnormal results are displayed) Labs Reviewed  BASIC METABOLIC PANEL - Abnormal; Notable for the following components:      Result Value   Potassium 5.5 (*)    Glucose, Bld 120 (*)    BUN 26 (*)    Creatinine, Ser 1.66 (*)    GFR calc non Af Amer 27 (*)    GFR calc Af Amer 31 (*)    All other components within normal limits  CBC - Abnormal; Notable for the following components:   WBC 11.6 (*)    Hemoglobin 15.9 (*)    HCT 48.9 (*)    MCV 102.1 (*)    All other components within normal limits  URINALYSIS, ROUTINE W REFLEX MICROSCOPIC - Abnormal; Notable for the following components:   APPearance CLOUDY (*)    Hgb urine dipstick MODERATE (*)    Nitrite POSITIVE (*)    Leukocytes,  UA LARGE (*)    WBC, UA >50 (*)    Bacteria, UA MANY (*)    All other components within normal limits  CBG MONITORING, ED    EKG EKG Interpretation  Date/Time:  Friday December 11 2017 12:57:38 EDT Ventricular Rate:  64 PR Interval:    QRS Duration: 84 QT Interval:  405 QTC Calculation: 418 R Axis:   -10 Text Interpretation:  Sinus rhythm Borderline ST elevation, anterior leads similar to previous EKG 06/07/16 Confirmed by Raeford Razor [phone]) on 12/11/2017 1:30:17 PM   Radiology Ct Head Wo Contrast  Result Date: 12/11/2017 CLINICAL DATA:  Fall.  Dementia.  Anticoagulated. EXAM: CT HEAD WITHOUT CONTRAST TECHNIQUE: Contiguous axial images were obtained from the base of the skull through the vertex without intravenous contrast. COMPARISON:  02/18/2017. FINDINGS: Brain: No evidence for acute infarction, hemorrhage, mass lesion, hydrocephalus, or extra-axial fluid. Generalized atrophy. Hypoattenuation of white matter, likely small vessel disease. Vascular: Calcification of the cavernous internal carotid arteries consistent with cerebrovascular atherosclerotic disease. No signs of intracranial large vessel occlusion. Skull: Calvarium intact.  LEFT frontal scalp swelling. Sinuses/Orbits: LEFT maxillary, ethmoid, and frontal sinus opacity, LEFT ostiomeatal unit obstruction pattern. Fluid-filled LEFT concha bullosa. BILATERAL cataract extraction. No visible facial trauma. Other: LEFT maxillary molar periapical lucencies, and carries, dentigerous in origin. IMPRESSION: Atrophy and small vessel disease.  No acute intracranial findings. No skull fracture.  LEFT frontal soft tissue swelling. LEFT frontoethmoid and maxillary sinusitis. LEFT maxillary molar dental disease, recommend elective dental consultation. Electronically Signed   By: Elsie Stain M.D.   On: 12/11/2017 12:32    Procedures Procedures (including critical care time)  Medications Ordered in ED Medications - No data to  display   Initial Impression / Assessment and Plan / ED Course  I have reviewed the triage vital signs and the nursing notes.  Pertinent labs & imaging results that were available during my care of the patient were reviewed by me and considered in my medical decision making (see chart for details).     84 year old female with fall.  No loss of consciousness.  Aside from mild tremor in upper extremities her neuro exam is nonfocal.  She denies any acute pain.  Imaging negative for serious acute traumatic injury.  She does have a urinary tract infection.  She is prophylactically on trimethoprim.  We will place her on a cephalosporin.  Urine culture.  I feel she is appropriate for outpatient treatment. Encourage fluids.   Final Clinical Impressions(s) / ED Diagnoses   Final diagnoses:  Minor head injury, initial encounter  Acute cystitis with hematuria    ED Discharge Orders    None       Raeford Razor, MD 12/11/17 1358

## 2017-12-11 NOTE — ED Triage Notes (Signed)
Pt in after fall last night, unknown how long she was on the floor, found by son this am, pt alert to her baseline but is noted to have tremors to her hands, pt has a bump to her head, does take blood thinners

## 2017-12-13 LAB — URINE CULTURE: Culture: >=100000 — AB

## 2017-12-14 ENCOUNTER — Telehealth: Payer: Self-pay | Admitting: Emergency Medicine

## 2017-12-14 NOTE — Telephone Encounter (Signed)
Post ED Visit - Positive Culture Follow-up  Culture report reviewed by antimicrobial stewardship pharmacist:  []  Enzo Bi, Pharm.D. []  Celedonio Miyamoto, Pharm.D., BCPS AQ-ID []  Garvin Fila, Pharm.D., BCPS []  Georgina Pillion, Pharm.D., BCPS []  [place], [place].D., BCPS, AAHIVP []  Estella Husk, Pharm.D., BCPS, AAHIVP []  Lysle Pearl, PharmD, BCPS []  Phillips Climes, PharmD, BCPS []  Agapito Games, PharmD, BCPS []  Verlan Friends, PharmD Babs Bertin PharmD  Positive urine culture Treated with cephalexin, organism sensitive to the same and no further patient follow-up is required at this time.  Berle Mull 12/14/2017, 10:14 AM

## 2019-03-04 DEATH — deceased

## 2019-06-27 IMAGING — CT CT HEAD W/O CM
4 series · 16 of 47 positions shown, 18 images · non-contrast
Comparison: 02/18/2017.

CLINICAL DATA: Fall.  Dementia.  Anticoagulated.

EXAM:
CT HEAD WITHOUT CONTRAST
TECHNIQUE: Contiguous axial images were obtained from the base of the skull
through the vertex without intravenous contrast.

[Series 3: head wo · axial · 0.42mm/px · z∈[-135,-20]mm · 7 of 31 slices shown, 9 images]
[im 4/31  brain]
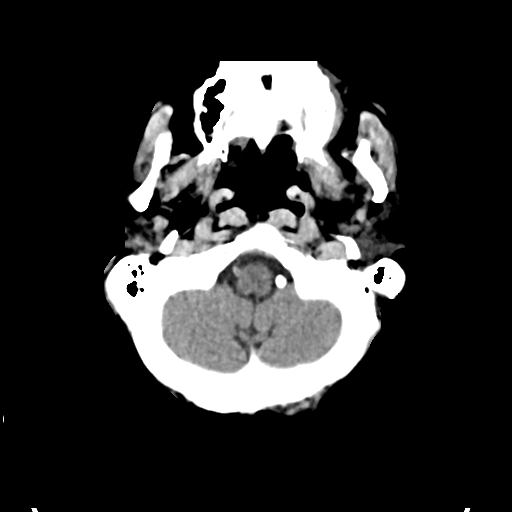
[im 4/31  bone]
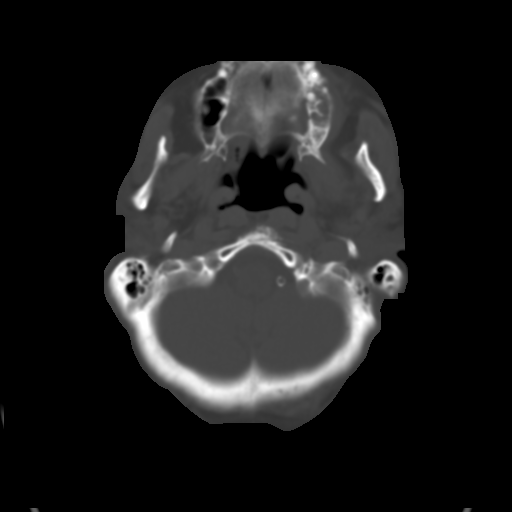
[im 8/31  brain]
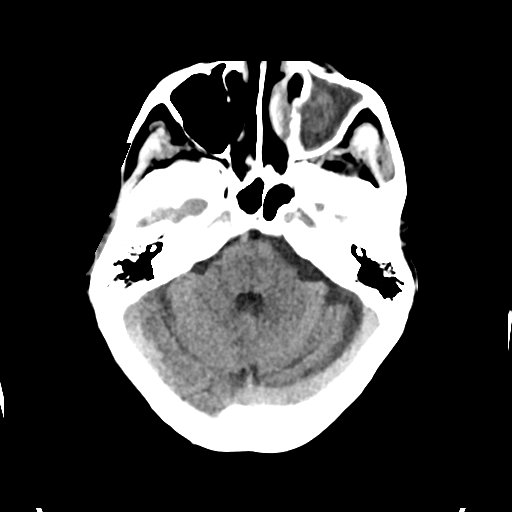
[im 12/31  brain]
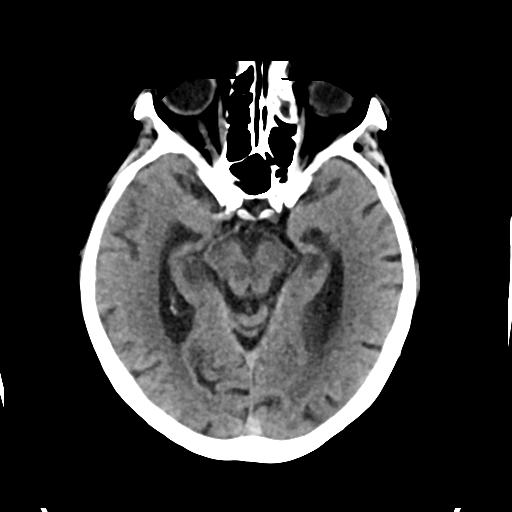
[im 16/31  brain]
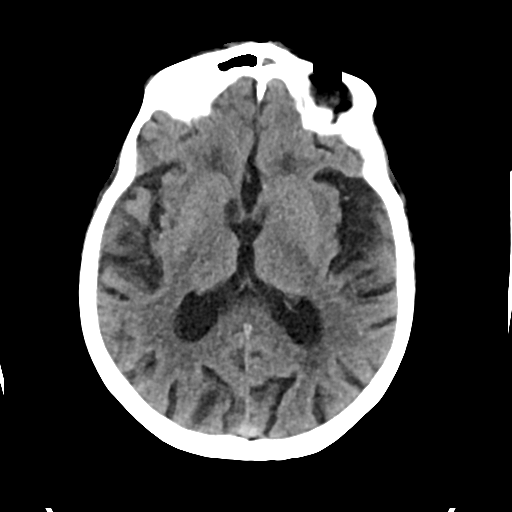
[im 19/31  brain]
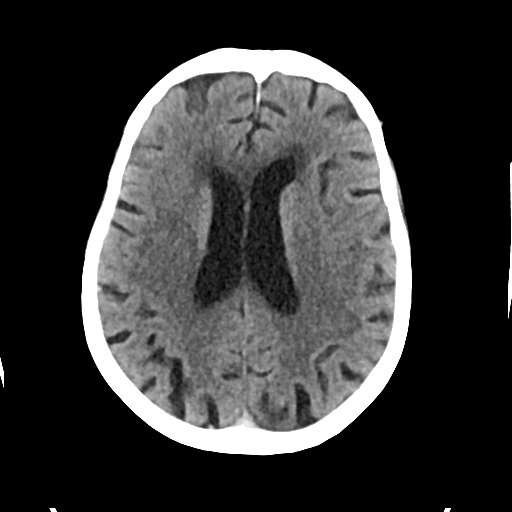
[im 19/31  bone]
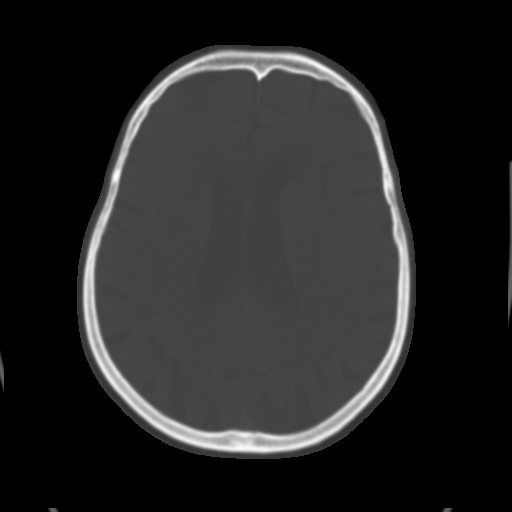
[im 23/31  brain]
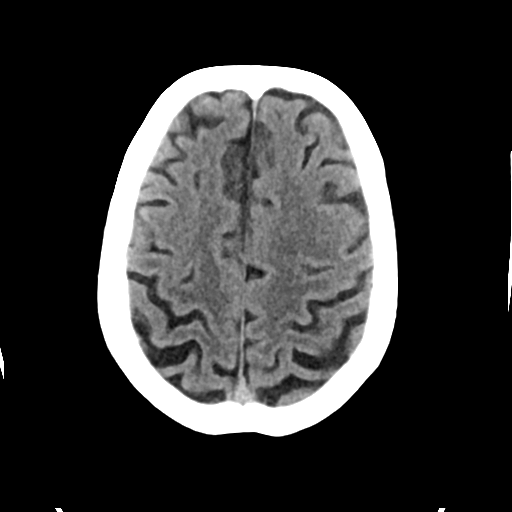
[im 27/31  brain]
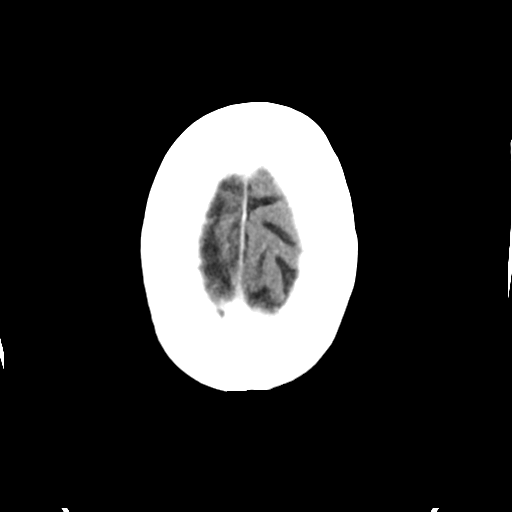

[Series 4: head bone · axial · 0.42mm/px · z∈[-136,-104]mm · 3 of 78 slices shown]
[im 8/78  bone]
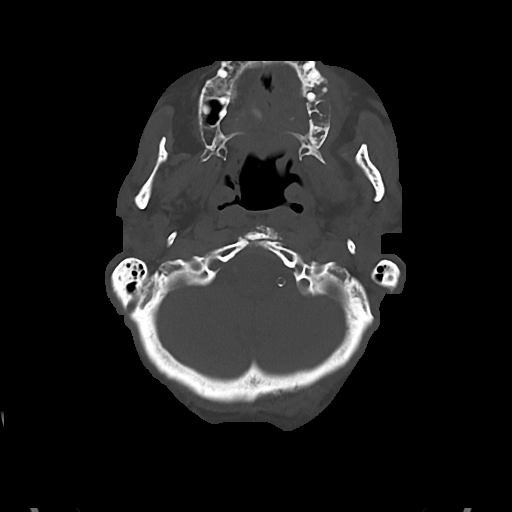
[im 16/78  bone]
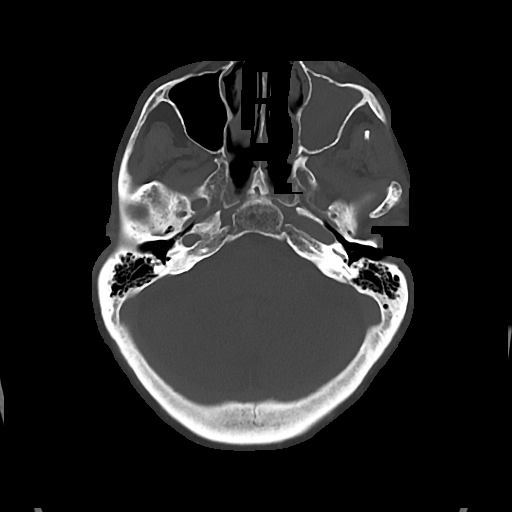
[im 24/78  bone]
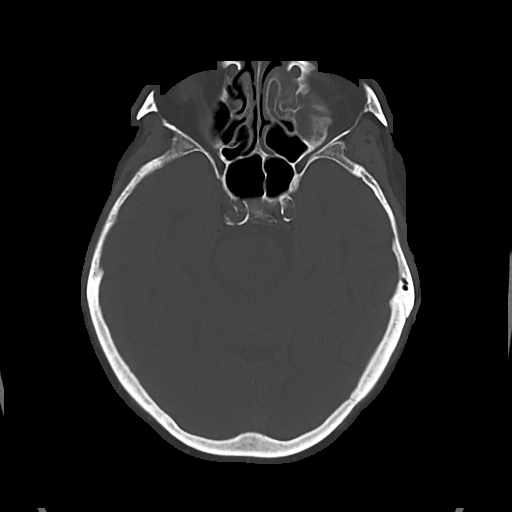

[Series 5: cor soft · coronal · 0.31mm/px · 3 of 67 slices shown]
[im 23/67  brain]
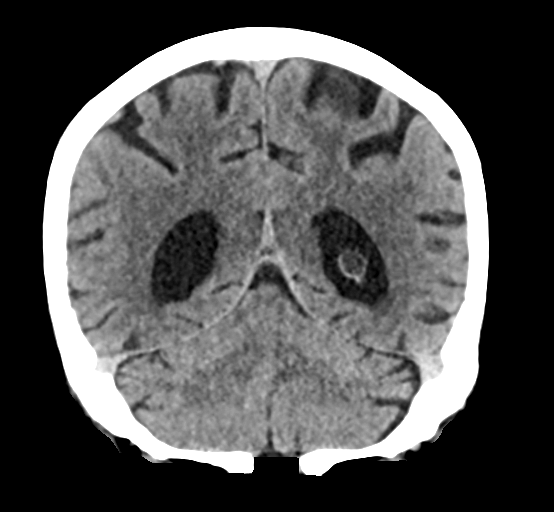
[im 30/67  brain]
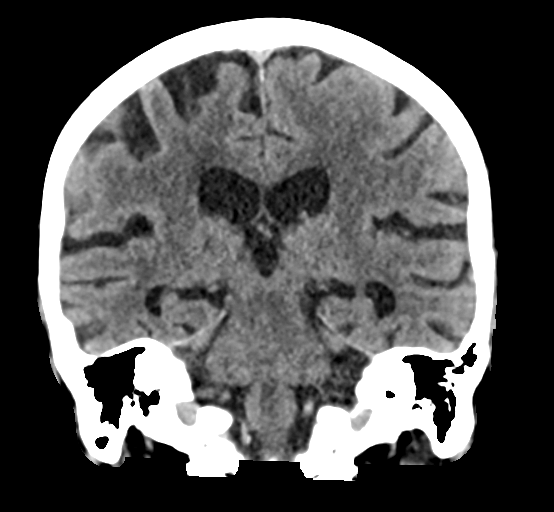
[im 37/67  brain]
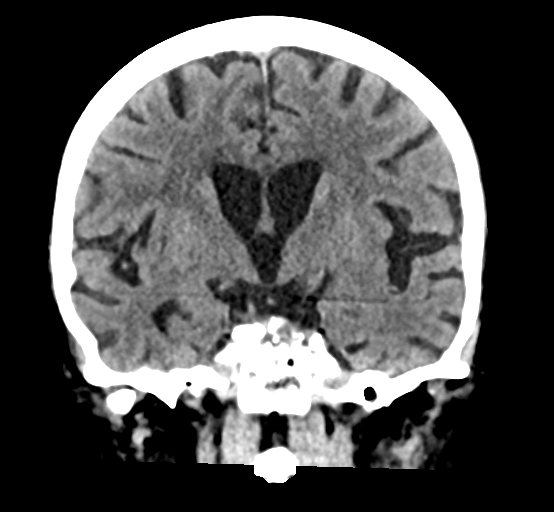

[Series 6: sag soft · sagittal · 0.30mm/px · 3 of 57 slices shown]
[im 19/57  brain]
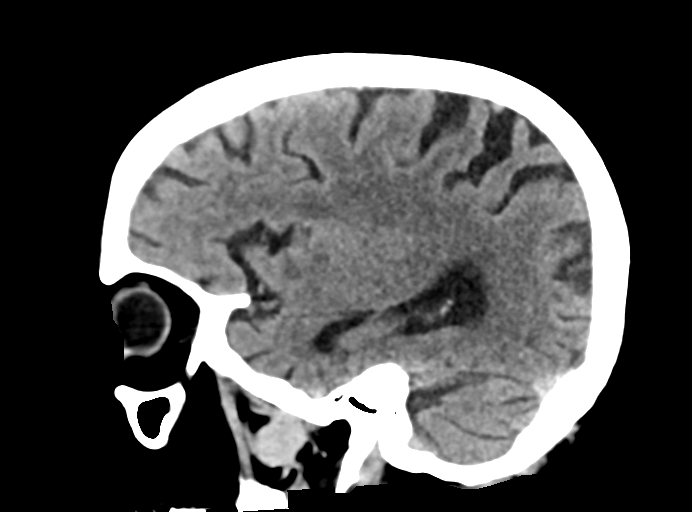
[im 29/57  brain]
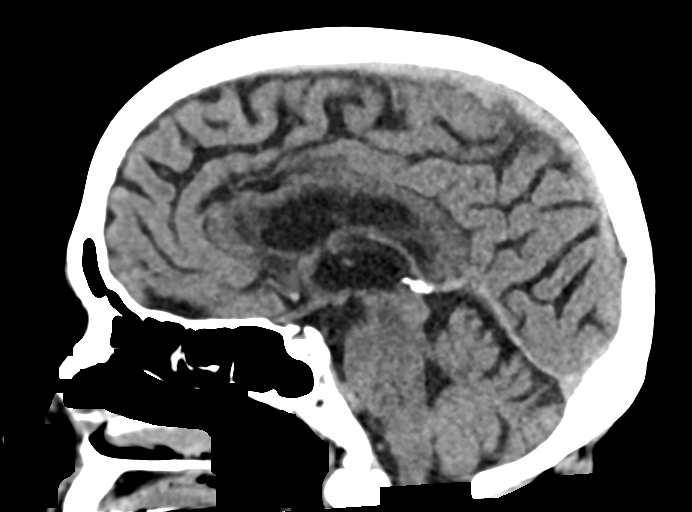
[im 38/57  brain]
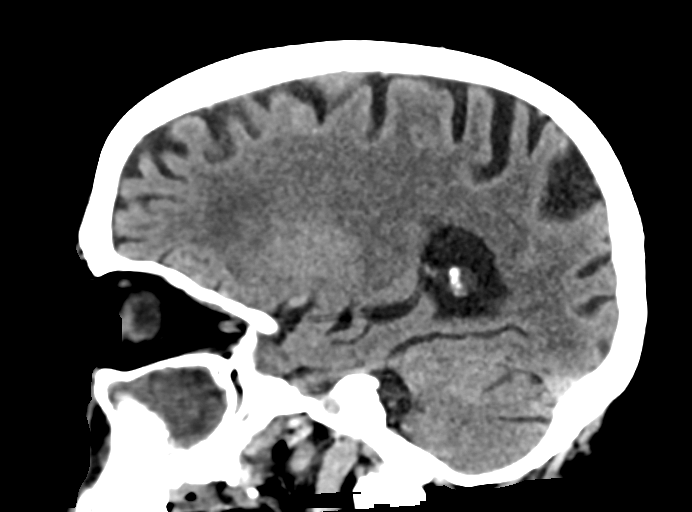

[16 of 47 positions shown; findings below may reference images not displayed]

FINDINGS: Brain: No evidence for acute infarction, hemorrhage, mass lesion,
hydrocephalus, or extra-axial fluid. Generalized atrophy.
Hypoattenuation of white matter, likely small vessel disease.

Vascular: Calcification of the cavernous internal carotid arteries
consistent with cerebrovascular atherosclerotic disease. No signs of
intracranial large vessel occlusion.

Skull: Calvarium intact.  LEFT frontal scalp swelling.

Sinuses/Orbits: LEFT maxillary, ethmoid, and frontal sinus opacity,
LEFT ostiomeatal unit obstruction pattern. Fluid-filled LEFT concha
bullosa. BILATERAL cataract extraction. No visible facial trauma.

Other: LEFT maxillary molar periapical lucencies, and carries,
dentigerous in origin.
IMPRESSION: Atrophy and small vessel disease.  No acute intracranial findings.

No skull fracture.  LEFT frontal soft tissue swelling.

LEFT frontoethmoid and maxillary sinusitis.

LEFT maxillary molar dental disease, recommend elective dental
consultation.
